# Patient Record
Sex: Female | Born: 1980 | Race: White | Hispanic: No | Marital: Married | Smoking: Never smoker
Health system: Southern US, Community
[De-identification: ages and names within clinical notes are randomized; demographics above are authoritative.]

## PROBLEM LIST (undated history)

## (undated) DIAGNOSIS — F32A Depression, unspecified: Secondary | ICD-10-CM

## (undated) DIAGNOSIS — F329 Major depressive disorder, single episode, unspecified: Secondary | ICD-10-CM

## (undated) DIAGNOSIS — T4145XA Adverse effect of unspecified anesthetic, initial encounter: Secondary | ICD-10-CM

## (undated) DIAGNOSIS — L732 Hidradenitis suppurativa: Secondary | ICD-10-CM

## (undated) DIAGNOSIS — E785 Hyperlipidemia, unspecified: Secondary | ICD-10-CM

## (undated) DIAGNOSIS — O909 Complication of the puerperium, unspecified: Secondary | ICD-10-CM

## (undated) DIAGNOSIS — M419 Scoliosis, unspecified: Secondary | ICD-10-CM

## (undated) DIAGNOSIS — L309 Dermatitis, unspecified: Secondary | ICD-10-CM

## (undated) DIAGNOSIS — Z973 Presence of spectacles and contact lenses: Secondary | ICD-10-CM

## (undated) DIAGNOSIS — K219 Gastro-esophageal reflux disease without esophagitis: Secondary | ICD-10-CM

## (undated) DIAGNOSIS — T8859XA Other complications of anesthesia, initial encounter: Secondary | ICD-10-CM

## (undated) HISTORY — PX: COLONOSCOPY: SHX174

## (undated) HISTORY — PX: CHOLECYSTECTOMY: SHX55

## (undated) HISTORY — PX: ABDOMINAL HYSTERECTOMY: SHX81

## (undated) HISTORY — PX: UPPER GI ENDOSCOPY: SHX6162

---

## 2015-10-24 DIAGNOSIS — E785 Hyperlipidemia, unspecified: Secondary | ICD-10-CM | POA: Insufficient documentation

## 2016-02-27 ENCOUNTER — Ambulatory Visit: Payer: Self-pay | Admitting: Internal Medicine

## 2016-06-04 DIAGNOSIS — E782 Mixed hyperlipidemia: Secondary | ICD-10-CM | POA: Insufficient documentation

## 2017-02-16 ENCOUNTER — Inpatient Hospital Stay
Admission: EM | Admit: 2017-02-16 | Discharge: 2017-02-17 | DRG: 871 | Disposition: A | Discharge status: Home / Self Care | Payer: 59 | Attending: Internal Medicine | Admitting: Internal Medicine

## 2017-02-16 ENCOUNTER — Emergency Department: Payer: 59

## 2017-02-16 ENCOUNTER — Encounter: Payer: Self-pay | Admitting: Emergency Medicine

## 2017-02-16 ENCOUNTER — Other Ambulatory Visit: Payer: Self-pay

## 2017-02-16 DIAGNOSIS — J9601 Acute respiratory failure with hypoxia: Secondary | ICD-10-CM | POA: Diagnosis present

## 2017-02-16 DIAGNOSIS — K2960 Other gastritis without bleeding: Secondary | ICD-10-CM | POA: Diagnosis present

## 2017-02-16 DIAGNOSIS — Z9049 Acquired absence of other specified parts of digestive tract: Secondary | ICD-10-CM | POA: Diagnosis not present

## 2017-02-16 DIAGNOSIS — F329 Major depressive disorder, single episode, unspecified: Secondary | ICD-10-CM | POA: Diagnosis present

## 2017-02-16 DIAGNOSIS — G8929 Other chronic pain: Secondary | ICD-10-CM | POA: Diagnosis present

## 2017-02-16 DIAGNOSIS — E785 Hyperlipidemia, unspecified: Secondary | ICD-10-CM | POA: Diagnosis present

## 2017-02-16 DIAGNOSIS — Z90710 Acquired absence of both cervix and uterus: Secondary | ICD-10-CM

## 2017-02-16 DIAGNOSIS — K529 Noninfective gastroenteritis and colitis, unspecified: Secondary | ICD-10-CM | POA: Diagnosis present

## 2017-02-16 DIAGNOSIS — J189 Pneumonia, unspecified organism: Secondary | ICD-10-CM

## 2017-02-16 DIAGNOSIS — R1010 Upper abdominal pain, unspecified: Secondary | ICD-10-CM

## 2017-02-16 DIAGNOSIS — K2210 Ulcer of esophagus without bleeding: Secondary | ICD-10-CM | POA: Diagnosis present

## 2017-02-16 DIAGNOSIS — K449 Diaphragmatic hernia without obstruction or gangrene: Secondary | ICD-10-CM | POA: Diagnosis present

## 2017-02-16 DIAGNOSIS — Z79899 Other long term (current) drug therapy: Secondary | ICD-10-CM | POA: Diagnosis not present

## 2017-02-16 DIAGNOSIS — A419 Sepsis, unspecified organism: Principal | ICD-10-CM | POA: Diagnosis present

## 2017-02-16 DIAGNOSIS — J690 Pneumonitis due to inhalation of food and vomit: Secondary | ICD-10-CM | POA: Diagnosis present

## 2017-02-16 DIAGNOSIS — M4184 Other forms of scoliosis, thoracic region: Secondary | ICD-10-CM | POA: Diagnosis present

## 2017-02-16 HISTORY — DX: Complication of the puerperium, unspecified: O90.9

## 2017-02-16 HISTORY — DX: Depression, unspecified: F32.A

## 2017-02-16 HISTORY — DX: Hyperlipidemia, unspecified: E78.5

## 2017-02-16 HISTORY — DX: Major depressive disorder, single episode, unspecified: F32.9

## 2017-02-16 LAB — COMPREHENSIVE METABOLIC PANEL
ALT: 26 U/L (ref 14–54)
AST: 34 U/L (ref 15–41)
Albumin: 4.3 g/dL (ref 3.5–5.0)
Alkaline Phosphatase: 86 U/L (ref 38–126)
Anion gap: 6 (ref 5–15)
BILIRUBIN TOTAL: 0.7 mg/dL (ref 0.3–1.2)
BUN: 11 mg/dL (ref 6–20)
CALCIUM: 9.3 mg/dL (ref 8.9–10.3)
CO2: 24 mmol/L (ref 22–32)
CREATININE: 0.91 mg/dL (ref 0.44–1.00)
Chloride: 107 mmol/L (ref 101–111)
Glucose, Bld: 130 mg/dL — ABNORMAL HIGH (ref 65–99)
Potassium: 3.9 mmol/L (ref 3.5–5.1)
Sodium: 137 mmol/L (ref 135–145)
TOTAL PROTEIN: 8 g/dL (ref 6.5–8.1)

## 2017-02-16 LAB — URINALYSIS, COMPLETE (UACMP) WITH MICROSCOPIC
Bilirubin Urine: NEGATIVE
GLUCOSE, UA: NEGATIVE mg/dL
KETONES UR: NEGATIVE mg/dL
LEUKOCYTES UA: NEGATIVE
NITRITE: NEGATIVE
PH: 7 (ref 5.0–8.0)
Protein, ur: NEGATIVE mg/dL
Specific Gravity, Urine: 1.009 (ref 1.005–1.030)

## 2017-02-16 LAB — INFLUENZA PANEL BY PCR (TYPE A & B)
Influenza A By PCR: NEGATIVE
Influenza B By PCR: NEGATIVE

## 2017-02-16 LAB — LIPASE, BLOOD: Lipase: 27 U/L (ref 11–51)

## 2017-02-16 LAB — CBC
HCT: 42.9 % (ref 35.0–47.0)
Hemoglobin: 14.2 g/dL (ref 12.0–16.0)
MCH: 27.3 pg (ref 26.0–34.0)
MCHC: 33 g/dL (ref 32.0–36.0)
MCV: 82.7 fL (ref 80.0–100.0)
PLATELETS: 267 10*3/uL (ref 150–440)
RBC: 5.19 MIL/uL (ref 3.80–5.20)
RDW: 14.1 % (ref 11.5–14.5)
WBC: 18.4 10*3/uL — ABNORMAL HIGH (ref 3.6–11.0)

## 2017-02-16 LAB — LACTIC ACID, PLASMA
Lactic Acid, Venous: 1.5 mmol/L (ref 0.5–1.9)
Lactic Acid, Venous: 2 mmol/L (ref 0.5–1.9)

## 2017-02-16 LAB — TROPONIN I

## 2017-02-16 LAB — PREGNANCY, URINE: Preg Test, Ur: NEGATIVE

## 2017-02-16 MED ORDER — ONDANSETRON HCL 4 MG/2ML IJ SOLN: 4.0000 mg | Freq: Four times a day (QID) | INTRAMUSCULAR | Status: DC | PRN

## 2017-02-16 MED ORDER — POLYETHYLENE GLYCOL 3350 17 G PO PACK: 17.0000 g | PACK | Freq: Every day | ORAL | Status: DC | PRN

## 2017-02-16 MED ORDER — IBUPROFEN 400 MG PO TABS: 400.0000 mg | ORAL_TABLET | Freq: Four times a day (QID) | ORAL | Status: DC | PRN

## 2017-02-16 MED ORDER — ENOXAPARIN SODIUM 40 MG/0.4ML ~~LOC~~ SOLN
40.0000 mg | SUBCUTANEOUS | Status: DC
  Administered 2017-02-16: 40 mg via SUBCUTANEOUS
  Filled 2017-02-16: qty 0.4

## 2017-02-16 MED ORDER — METHYLPREDNISOLONE SODIUM SUCC 125 MG IJ SOLR
60.0000 mg | Freq: Once | INTRAMUSCULAR | Status: AC
  Administered 2017-02-16: 60 mg via INTRAVENOUS
  Filled 2017-02-16: qty 2

## 2017-02-16 MED ORDER — ONDANSETRON HCL 4 MG PO TABS: 4.0000 mg | ORAL_TABLET | Freq: Four times a day (QID) | ORAL | Status: DC | PRN

## 2017-02-16 MED ORDER — SODIUM CHLORIDE 0.9 % IV BOLUS (SEPSIS)
1000.0000 mL | Freq: Once | INTRAVENOUS | Status: AC
  Administered 2017-02-16: 1000 mL via INTRAVENOUS

## 2017-02-16 MED ORDER — MORPHINE SULFATE (PF) 2 MG/ML IV SOLN: 2.0000 mg | INTRAVENOUS | Status: DC | PRN

## 2017-02-16 MED ORDER — ACETAMINOPHEN 325 MG PO TABS: 650.0000 mg | ORAL_TABLET | Freq: Four times a day (QID) | ORAL | Status: DC | PRN

## 2017-02-16 MED ORDER — SODIUM CHLORIDE 0.9 % IV SOLN
2.0000 g | Freq: Once | INTRAVENOUS | Status: AC
  Administered 2017-02-16: 2 g via INTRAVENOUS
  Filled 2017-02-16: qty 2

## 2017-02-16 MED ORDER — HYDROMORPHONE HCL 1 MG/ML IJ SOLN
1.0000 mg | Freq: Once | INTRAMUSCULAR | Status: AC
  Administered 2017-02-16: 1 mg via INTRAVENOUS

## 2017-02-16 MED ORDER — SERTRALINE HCL 50 MG PO TABS: 100.0000 mg | ORAL_TABLET | Freq: Every day | ORAL | Status: DC

## 2017-02-16 MED ORDER — ONDANSETRON HCL 4 MG/2ML IJ SOLN
4.0000 mg | Freq: Once | INTRAMUSCULAR | Status: AC
  Administered 2017-02-16: 4 mg via INTRAVENOUS
  Filled 2017-02-16: qty 2

## 2017-02-16 MED ORDER — HYDROCODONE-ACETAMINOPHEN 5-325 MG PO TABS
1.0000 | ORAL_TABLET | ORAL | Status: DC | PRN
  Administered 2017-02-17: 02:00:00 1 via ORAL
  Filled 2017-02-16: qty 1

## 2017-02-16 MED ORDER — PRAVASTATIN SODIUM 20 MG PO TABS: 10.0000 mg | ORAL_TABLET | Freq: Every day | ORAL | Status: DC

## 2017-02-16 MED ORDER — DIPHENHYDRAMINE HCL 25 MG PO CAPS
50.0000 mg | ORAL_CAPSULE | Freq: Four times a day (QID) | ORAL | Status: DC | PRN
  Administered 2017-02-16: 23:00:00 50 mg via ORAL
  Filled 2017-02-16: qty 2

## 2017-02-16 MED ORDER — BISACODYL 5 MG PO TBEC: 5.0000 mg | DELAYED_RELEASE_TABLET | Freq: Every day | ORAL | Status: DC | PRN

## 2017-02-16 MED ORDER — LEVOFLOXACIN IN D5W 750 MG/150ML IV SOLN
750.0000 mg | INTRAVENOUS | Status: DC
  Administered 2017-02-17: 750 mg via INTRAVENOUS
  Filled 2017-02-16 (×2): qty 150

## 2017-02-16 MED ORDER — MORPHINE SULFATE (PF) 4 MG/ML IV SOLN
4.0000 mg | Freq: Once | INTRAVENOUS | Status: AC
  Administered 2017-02-16: 4 mg via INTRAVENOUS

## 2017-02-16 MED ORDER — ACETAMINOPHEN 650 MG RE SUPP: 650.0000 mg | Freq: Four times a day (QID) | RECTAL | Status: DC | PRN

## 2017-02-16 MED ORDER — MORPHINE SULFATE (PF) 4 MG/ML IV SOLN
4.0000 mg | Freq: Once | INTRAVENOUS | Status: AC
  Administered 2017-02-16: 4 mg via INTRAVENOUS
  Filled 2017-02-16: qty 1

## 2017-02-16 MED ORDER — HYDROCORTISONE 1 % EX CREA
1.0000 "application " | TOPICAL_CREAM | Freq: Three times a day (TID) | CUTANEOUS | Status: DC | PRN
  Administered 2017-02-16: 1 via TOPICAL
  Filled 2017-02-16: qty 28

## 2017-02-16 MED ORDER — IOPAMIDOL (ISOVUE-300) INJECTION 61%
30.0000 mL | Freq: Once | INTRAVENOUS | Status: AC | PRN
  Administered 2017-02-16: 30 mL via ORAL

## 2017-02-16 MED ORDER — HYDROMORPHONE HCL 1 MG/ML IJ SOLN
INTRAMUSCULAR | Status: AC
  Filled 2017-02-16: qty 1

## 2017-02-16 MED ORDER — VANCOMYCIN HCL IN DEXTROSE 1-5 GM/200ML-% IV SOLN
1000.0000 mg | Freq: Once | INTRAVENOUS | Status: AC
  Administered 2017-02-16: 1000 mg via INTRAVENOUS
  Filled 2017-02-16: qty 200

## 2017-02-16 MED ORDER — IOPAMIDOL (ISOVUE-370) INJECTION 76%
125.0000 mL | Freq: Once | INTRAVENOUS | Status: AC | PRN
  Administered 2017-02-16: 125 mL via INTRAVENOUS

## 2017-02-16 MED ORDER — MORPHINE SULFATE (PF) 4 MG/ML IV SOLN
INTRAVENOUS | Status: AC
  Administered 2017-02-16: 4 mg via INTRAVENOUS
  Filled 2017-02-16: qty 1

## 2017-02-16 NOTE — Progress Notes (Signed)
Pharmacy Antibiotic Note  Jennifer Mcintyre is a 37 y.o. female admitted on 02/16/2017 with pneumonia.  Pharmacy has been consulted for levofloxacin dosing.  Plan: Levofloxacin 750 mg IV q24h  Height: 6\' 1"  (185.4 cm) Weight: 230 lb (104.3 kg) IBW/kg (Calculated) : 75.4  Temp (24hrs), Avg:99.8 F (37.7 C), Min:99.8 F (37.7 C), Max:99.8 F (37.7 C)  Recent Labs  Lab 02/16/17 1544 02/16/17 1559  WBC 18.4*  --   CREATININE 0.91  --   LATICACIDVEN  --  1.5    Estimated Creatinine Clearance: 117.4 mL/min (by C-G formula based on SCr of 0.91 mg/dL).    No Known Allergies  Antimicrobials this admission: levofloxacin 2/13 >>  Vancomycin + cefepime in ED 2/13  Dose adjustments this admission:  Microbiology results: 2/13 BCx: Sent  Thank you for allowing pharmacy to be a part of this patient's care.  Cindi CarbonMary M Sophea Rackham, PharmD, BCPS Clinical Pharmacist 02/16/2017 7:37 PM

## 2017-02-16 NOTE — Progress Notes (Signed)
CODE SEPSIS - PHARMACY COMMUNICATION  **Broad Spectrum Antibiotics should be administered within 1 hour of Sepsis diagnosis**  Time Code Sepsis Called/Page Received: 1845  Antibiotics Ordered: cefepime, vancomycin  Time of 1st antibiotic administration: 1931  Additional action taken by pharmacy: n/a  If necessary, Name of Provider/Nurse Contacted: n/a  Cindi CarbonMary M Amberlyn Martinezgarcia ,PharmD Clinical Pharmacist  02/16/2017  7:33 PM

## 2017-02-16 NOTE — ED Notes (Signed)
Pt states she has had body aches and fever since 1pm today.

## 2017-02-16 NOTE — ED Triage Notes (Signed)
Pt arrived from [facility]. Pt reports a drop in oxygen saturation during procedure and procedure was stopped. PT was then given albuterol treatment. PT went home and started to have "fever and chill." PT then went back to clinic who noticed a high pulse reading and sent patient into ED to be evaluated.

## 2017-02-16 NOTE — Progress Notes (Signed)
CRITICAL VALUE ALERT  Critical Value:  Lactic Acid of 2.0  Date & Time Notied:  02/16/2017 2215  Provider Notified: Cammy CopaAngela Maier, MD  Orders Received/Actions taken: Will continue with present treatment plan per MD

## 2017-02-16 NOTE — ED Provider Notes (Signed)
[facility] Emergency Department Provider Note  ____________________________________________   First MD Initiated Contact with Patient 02/16/17 1556     (approximate)  I have reviewed the triage vital signs and the nursing notes.   HISTORY  Chief Complaint Abdominal Pain   HPI Jennifer Mcintyre is a 37 y.o. female with a history of depression, as well as chronic diarrhea and abdominal pain status post EGD this morning who is presenting with fever and back pain.  Patient says that her pain is a 7 out of 10 and sharp.  She says the pain is to her left lower thoracic and left upper lumbar region.  It is worse with inspiration.  Patient's EGD had to be stopped this morning after she desaturated to the 60s in the 70s.  I discussed the case with Dr. Marva PandaSkulskie who was performing the EGD.  He says that the patient was sedated with propofol by anesthesia.  He says that she desatted and started to cough.  He said that while this was happening he was doing biopsies in the duodenum.  He says that the hypoxia resolved as the patient's sedation wore off.  However, when the patient came back to the office later in the day she had a fever as well as tachycardia.  Patient denies any cough at this time.  Denies any shortness of breath.  Denies any known sick contacts.  Says that she has been having body aches.  Says that she took Tylenol at 1 PM in response to saying that her temperature was 101 at home.  Dr. Marva PandaSkulskie sent her to the emergency department for further workup and with specific concern for "microperforations."   Past Medical History:  Diagnosis Date  . Cesarean section wound complication   . Depression   . Hyperlipemia     Patient Active Problem List   Diagnosis Date Noted  . Sepsis (HCC) 02/16/2017    Past Surgical History:  Procedure Laterality Date  . CHOLECYSTECTOMY    . COLONOSCOPY      Prior to Admission medications   Medication Sig Start Date End Date  Taking? Authorizing Provider  buPROPion (WELLBUTRIN) 100 MG tablet Take 300 mg by mouth 2 (two) times daily.   Yes [provider]  pravastatin (PRAVACHOL) 10 MG tablet Take 10 mg by mouth daily.   Yes [provider]  sertraline (ZOLOFT) 100 MG tablet Take 100 mg by mouth daily.   Yes [provider]    Allergies Patient has no known allergies.  No family history on file.  Social History Social History   Tobacco Use  . Smoking status: Never Smoker  . Smokeless tobacco: Never Used  Substance Use Topics  . Alcohol use: Yes  . Drug use: No    Review of Systems  Constitutional: No fever/chills Eyes: No visual changes. ENT: No sore throat. Cardiovascular: Denies chest pain. Respiratory: Denies shortness of breath. Gastrointestinal:   No nausea, no vomiting.  No diarrhea.   Genitourinary: Negative for dysuria. Musculoskeletal: As above Skin: Negative for rash. Neurological: Negative for headaches, focal weakness or numbness.   ____________________________________________   PHYSICAL EXAM:  VITAL SIGNS: ED Triage Vitals  Enc Vitals Group     BP 02/16/17 1543 129/78     Pulse Rate 02/16/17 1543 (!) 131     Resp 02/16/17 1543 (!) 22     Temp 02/16/17 1543 99.8 F (37.7 C)     Temp Source 02/16/17 1543 Oral  SpO2 02/16/17 1543 96 %     Weight 02/16/17 1543 230 lb (104.3 kg)     Height 02/16/17 1543 6\' 1"  (1.854 m)     Head Circumference --      Peak Flow --      Pain Score 02/16/17 1542 7     Pain Loc --      Pain Edu? --      Excl. in GC? --     Constitutional: Alert and oriented. Well appearing and in no acute distress. Eyes: Conjunctivae are normal.  Head: Atraumatic. Nose: No congestion/rhinnorhea. Mouth/Throat: Mucous membranes are moist.  Neck: No stridor.   Cardiovascular: Tachycardic, regular rhythm. Grossly normal heart sounds.  Good peripheral circulation. Respiratory: Normal respiratory effort.  No retractions. Lungs  CTAB. Gastrointestinal: Soft with moderate left upper quadrant tenderness without rebound or guarding. No distention. No CVA tenderness. Musculoskeletal: No lower extremity tenderness nor edema.  No joint effusions. Neurologic:  Normal speech and language. No gross focal neurologic deficits are appreciated. Skin:  Skin is warm, dry and intact. No rash noted. Psychiatric: Mood and affect are normal. Speech and behavior are normal.  ____________________________________________   LABS (all labs ordered are listed, but only abnormal results are displayed)  Labs Reviewed  COMPREHENSIVE METABOLIC PANEL - Abnormal; Notable for the following components:      Result Value   Glucose, Bld 130 (*)    All other components within normal limits  CBC - Abnormal; Notable for the following components:   WBC 18.4 (*)    All other components within normal limits  URINALYSIS, COMPLETE (UACMP) WITH MICROSCOPIC - Abnormal; Notable for the following components:   Color, Urine YELLOW (*)    APPearance HAZY (*)    Hgb urine dipstick SMALL (*)    Bacteria, UA RARE (*)    Squamous Epithelial / LPF 0-5 (*)    All other components within normal limits  CULTURE, BLOOD (ROUTINE X 2)  CULTURE, BLOOD (ROUTINE X 2)  LIPASE, BLOOD  LACTIC ACID, PLASMA  PREGNANCY, URINE  INFLUENZA PANEL BY PCR (TYPE A & B)  TROPONIN I  LACTIC ACID, PLASMA  INFLUENZA PANEL BY PCR (TYPE A & B)   ____________________________________________  EKG  ED ECG REPORT I, Arelia Longest, the attending physician, personally viewed and interpreted this ECG.   Date: 02/16/2017  EKG Time: 1750  Rate: 100  Rhythm: sinus tachycardia  Axis: Normal  Intervals:none  ST&T Change: No ST segment elevation or depression.  T wave inversions in 3 as well as aVF.  No previous for comparison.  ____________________________________________  RADIOLOGY  Left lower lobe scar versus atelectasis.  CT chest with bilateral lower lobe pneumonias  as well as lingular pneumonia on the left.  Abdomen without evidence of perforation or free air. ____________________________________________   PROCEDURES  Procedure(s) performed:   Procedures  Critical Care performed:   ____________________________________________   INITIAL IMPRESSION / ASSESSMENT AND PLAN / ED COURSE  Pertinent labs & imaging results that were available during my care of the patient were reviewed by me and considered in my medical decision making (see chart for details).  Differential diagnosis includes, but is not limited to, biliary disease (biliary colic, acute cholecystitis, cholangitis, choledocholithiasis, etc), intrathoracic causes for epigastric abdominal pain including ACS, gastritis, duodenitis, pancreatitis, small bowel or large bowel obstruction, abdominal aortic aneurysm, hernia, and gastritis. Differential includes, but is not limited to, viral syndrome, bronchitis including COPD exacerbation, pneumonia, reactive airway disease including asthma, CHF including  exacerbation with or without pulmonary/interstitial edema, pneumothorax, ACS, thoracic trauma, and pulmonary embolism. As part of my medical decision making, I reviewed the following data within the electronic MEDICAL RECORD NUMBER Old chart reviewed and Discussed the case with Dr. Marva Panda.    ----------------------------------------- 6:52 PM on 02/16/2017 -----------------------------------------  Patient requiring multiple doses of opiates.  She has received 8 mg of morphine as well as a milligram of Dilaudid.  She is still having epigastric as well as left upper quadrant pain at this time.  I discussed the case with Dr. Earlene Plater of surgery who also reviewed the scan who did not see any evidence of perforation.  I also discussed case with Dr. Marva Panda who says that he will see the patient in consultation in the morning.  Patient will be admitted for pneumonia.  Because the pneumonia could be from  aspiration and could have resulted during this hospital based procedure of she will be given broad-spectrum antibiotics.  Patient as well as husband are understanding of the diagnosis as well as treatment plan and willing to comply.  Signed out to Dr. Tildon Husky of the hospitalist service.  Sepsis alert called.      ____________________________________________   FINAL CLINICAL IMPRESSION(S) / ED DIAGNOSES  HCAP.  Upper abdominal pain. Sepsis    NEW MEDICATIONS STARTED DURING THIS VISIT:  New Prescriptions   No medications on file     Note:  This document was prepared using Dragon voice recognition software and may include unintentional dictation errors.     Myrna Blazer, MD 02/16/17 [phone]

## 2017-02-16 NOTE — H&P (Signed)
[facility] [place] [facility]   PATIENT NAME: Jennifer Mcintyre    MR#:  [id number]  DATE OF BIRTH:  July 14, 1980  DATE OF ADMISSION:  02/16/2017  PRIMARY CARE PHYSICIAN: Dan Humphreys, [facility]   REQUESTING/REFERRING PHYSICIAN: dr Langston Masker CHIEF COMPLAINT:   Abdominal pain and fevers HISTORY OF PRESENT ILLNESS:  Jennifer Mcintyre  is a 37 y.o. female with a known history of depression who presents today to the emergency room with a chief complaint of midepigastric abdominal pain and fevers. Patient had an EGD today that was aborted due to the patient having low oxygen saturations and started to cough. The hypoxia resolved as the patient's sedation wore off. Patient then went home and at home she developed fever and chills. She went back to the office and she was found to have tachycardia as well as fever. Patient reports prior to this procedure she did not have a fever. There was a concern of microperforations post-perforation given the patient's abdominal pain. CT scan of the abdomen and chest does not show evidence of perforation. ED physician also asked the surgery service to look at the CT scan and the surgeon apparently confirms that there is no evidence of perforation. Lipase level was normal. EGD and colonoscopy were planned today due to patient having chronic diarrhea    PAST MEDICAL HISTORY:   Past Medical History:  Diagnosis Date  . Cesarean section wound complication   . Depression   . Hyperlipemia     PAST SURGICAL HISTORY:   Past Surgical History:  Procedure Laterality Date  . CHOLECYSTECTOMY    . COLONOSCOPY      SOCIAL HISTORY:   Social History   Tobacco Use  . Smoking status: Never Smoker  . Smokeless tobacco: Never Used  Substance Use Topics  . Alcohol use: Yes    FAMILY HISTORY:  No family history on file.  DRUG ALLERGIES:  No Known Allergies  REVIEW OF SYSTEMS:   Review of Systems  Constitutional: Positive for chills and  fever. Negative for malaise/fatigue.  HENT: Negative.  Negative for ear discharge, ear pain, hearing loss, nosebleeds and sore throat.   Eyes: Negative.  Negative for blurred vision and pain.  Respiratory: Negative.  Negative for cough, hemoptysis, shortness of breath and wheezing.   Cardiovascular: Negative.  Negative for chest pain, palpitations and leg swelling.  Gastrointestinal: Positive for abdominal pain and diarrhea (Chronic). Negative for blood in stool, nausea and vomiting.  Genitourinary: Negative.  Negative for dysuria.  Musculoskeletal: Negative.  Negative for back pain.  Skin: Negative.   Neurological: Negative for dizziness, tremors, speech change, focal weakness, seizures and headaches.  Endo/Heme/Allergies: Negative.  Does not bruise/bleed easily.  Psychiatric/Behavioral: Negative.  Negative for depression, hallucinations and suicidal ideas.    MEDICATIONS AT HOME:   Prior to Admission medications   Medication Sig Start Date End Date Taking? Authorizing Provider  buPROPion (WELLBUTRIN) 100 MG tablet Take 300 mg by mouth 2 (two) times daily.   Yes [provider]  pravastatin (PRAVACHOL) 10 MG tablet Take 10 mg by mouth daily.   Yes [provider]  sertraline (ZOLOFT) 100 MG tablet Take 100 mg by mouth daily.   Yes [provider]      VITAL SIGNS:  Blood pressure 123/80, pulse (!) 131, temperature 99.8 F (37.7 C), temperature source Oral, resp. rate 18, height 6\' 1"  (1.854 m), weight 104.3 kg (230 lb), SpO2 96 %.  PHYSICAL EXAMINATION:   Physical Exam  Constitutional:  She is oriented to person, place, and time and well-developed, well-nourished, and in no distress. No distress.  HENT:  Head: Normocephalic.  Eyes: No scleral icterus.  Neck: Normal range of motion. Neck supple. No JVD present. No tracheal deviation present.  Cardiovascular: Normal rate, regular rhythm and normal heart sounds. Exam reveals no gallop and no friction rub.   No murmur heard. Pulmonary/Chest: Effort normal. No respiratory distress. She has no wheezes. She has no rales. She exhibits no tenderness.  Crackles heard at the left base  Abdominal: Soft. Bowel sounds are normal. She exhibits no distension and no mass. There is tenderness. There is no rebound and no guarding.  Musculoskeletal: Normal range of motion. She exhibits no edema.  Neurological: She is alert and oriented to person, place, and time.  Skin: Skin is warm. No rash noted. No erythema.  Psychiatric: Affect and judgment normal.      LABORATORY PANEL:   CBC Recent Labs  Lab 02/16/17 1544  WBC 18.4*  HGB 14.2  HCT 42.9  PLT 267   ------------------------------------------------------------------------------------------------------------------  Chemistries  Recent Labs  Lab 02/16/17 1544  NA 137  K 3.9  CL 107  CO2 24  GLUCOSE 130*  BUN 11  CREATININE 0.91  CALCIUM 9.3  AST 34  ALT 26  ALKPHOS 86  BILITOT 0.7   ------------------------------------------------------------------------------------------------------------------  Cardiac Enzymes Recent Labs  Lab 02/16/17 1640  TROPONINI <0.03   ------------------------------------------------------------------------------------------------------------------  RADIOLOGY:  Dg Chest 2 View  Result Date: 02/16/2017 CLINICAL DATA:  Hypoxia. EXAM: CHEST  2 VIEW COMPARISON:  none FINDINGS: Normal heart size. There is no pleural effusion or edema. Lungs are hyperinflated and there are coarsened interstitial markings noted bilaterally. Scar versus platelike atelectasis is identified within the left lower lobe. IMPRESSION: 1. Left lower lobe scar versus platelike atelectasis. 2. Hyperinflation. Electronically Signed   By: Signa Kellaylor  Stroud M.D.   On: 02/16/2017 16:40   Ct Angio Chest Pe W And/or Wo Contrast  Result Date: 02/16/2017 CLINICAL DATA:  37 year old female with hypoxia during EGD. Patient had fever and chills  after returning home. Dyspnea. EXAM: CT ANGIOGRAPHY CHEST CT ABDOMEN AND PELVIS WITH CONTRAST TECHNIQUE: Multidetector CT imaging of the chest was performed using the standard protocol during bolus administration of intravenous contrast. Multiplanar CT image reconstructions and MIPs were obtained to evaluate the vascular anatomy. Multidetector CT imaging of the abdomen and pelvis was performed using the standard protocol during bolus administration of intravenous contrast. CONTRAST:  125mL ISOVUE-370 IOPAMIDOL (ISOVUE-370) INJECTION 76% COMPARISON:  None. FINDINGS: CTA CHEST FINDINGS Cardiovascular: Heart size is normal. No pericardial effusion is identified. No large central pulmonary embolus is noted. Aorta is not aneurysmal. No dissection is visualized. Cardiac motion artifacts limit assessment along the ascending portion. Mediastinum/Nodes: No evidence of pneumomediastinum. Patent midline trachea and mainstem bronchi. Unremarkable CT appearance of the esophagus. There is no lymphadenopathy. Lungs/Pleura: Patchy airspace opacities are noted at each lung base left greater than right. Small lingular subpleural acinar opacities are also noted. Findings may reflect evolving pneumonia and atelectasis. No effusion or pneumothorax. Musculoskeletal: No chest wall abnormality. No acute or significant osseous findings. Review of the MIP images confirms the above findings. CT ABDOMEN and PELVIS FINDINGS Hepatobiliary: No focal liver abnormality is seen. Status post cholecystectomy. No biliary dilatation. Pancreas: Unremarkable. No pancreatic ductal dilatation or surrounding inflammatory changes. Spleen: Normal in size without focal abnormality. Adrenals/Urinary Tract: Adrenal glands are unremarkable. Kidneys are normal, without renal calculi, focal lesion, or hydronephrosis. 6 mm calcification is  seen along the left dependent wall of the bladder consistent with a bladder calculus. Given lack of hydroureteronephrosis, a  calculus in the distal most aspect of the left ureter is believed less likely. Stomach/Bowel: The stomach is physiologically distended with food. Normal small bowel rotation is noted without bowel obstruction or inflammation. The colon is unremarkable. The distal and terminal ileum are normal. Appendix is normal. Vascular/Lymphatic: No significant vascular findings are present. No enlarged abdominal or pelvic lymph nodes. Reproductive: Status post hysterectomy.  No adnexal mass. Other: Small periumbilical fat containing hernia. No abdominopelvic ascites. Musculoskeletal: No acute or significant osseous findings. Review of the MIP images confirms the above findings. IMPRESSION: 1. Patchy alveolar airspace opacities in the lingula and both lower lobes left greater than right compatible with pneumonia. 2. No acute solid nor hollow visceral organ abnormality within the abdomen and pelvis. 3. 6 mm left bladder calculus is noted.  No hydroureteronephrosis. Electronically Signed   By: Tollie Eth M.D.   On: 02/16/2017 18:23   Ct Abdomen Pelvis W Contrast  Result Date: 02/16/2017 CLINICAL DATA:  36 year old female with hypoxia during EGD. Patient had fever and chills after returning home. Dyspnea. EXAM: CT ANGIOGRAPHY CHEST CT ABDOMEN AND PELVIS WITH CONTRAST TECHNIQUE: Multidetector CT imaging of the chest was performed using the standard protocol during bolus administration of intravenous contrast. Multiplanar CT image reconstructions and MIPs were obtained to evaluate the vascular anatomy. Multidetector CT imaging of the abdomen and pelvis was performed using the standard protocol during bolus administration of intravenous contrast. CONTRAST:  ISOVUE-370 IOPAMIDOL (ISOVUE-370) INJECTION 76% COMPARISON:  None. FINDINGS: CTA CHEST FINDINGS Cardiovascular: Heart size is normal. No pericardial effusion is identified. No large central pulmonary embolus is noted. Aorta is not aneurysmal. No dissection is visualized.  Cardiac motion artifacts limit assessment along the ascending portion. Mediastinum/Nodes: No evidence of pneumomediastinum. Patent midline trachea and mainstem bronchi. Unremarkable CT appearance of the esophagus. There is no lymphadenopathy. Lungs/Pleura: Patchy airspace opacities are noted at each lung base left greater than right. Small lingular subpleural acinar opacities are also noted. Findings may reflect evolving pneumonia and atelectasis. No effusion or pneumothorax. Musculoskeletal: No chest wall abnormality. No acute or significant osseous findings. Review of the MIP images confirms the above findings. CT ABDOMEN and PELVIS FINDINGS Hepatobiliary: No focal liver abnormality is seen. Status post cholecystectomy. No biliary dilatation. Pancreas: Unremarkable. No pancreatic ductal dilatation or surrounding inflammatory changes. Spleen: Normal in size without focal abnormality. Adrenals/Urinary Tract: Adrenal glands are unremarkable. Kidneys are normal, without renal calculi, focal lesion, or hydronephrosis. 6 mm calcification is seen along the left dependent wall of the bladder consistent with a bladder calculus. Given lack of hydroureteronephrosis, a calculus in the distal most aspect of the left ureter is believed less likely. Stomach/Bowel: The stomach is physiologically distended with food. Normal small bowel rotation is noted without bowel obstruction or inflammation. The colon is unremarkable. The distal and terminal ileum are normal. Appendix is normal. Vascular/Lymphatic: No significant vascular findings are present. No enlarged abdominal or pelvic lymph nodes. Reproductive: Status post hysterectomy.  No adnexal mass. Other: Small periumbilical fat containing hernia. No abdominopelvic ascites. Musculoskeletal: No acute or significant osseous findings. Review of the MIP images confirms the above findings. IMPRESSION: 1. Patchy alveolar airspace opacities in the lingula and both lower lobes left  greater than right compatible with pneumonia. 2. No acute solid nor hollow visceral organ abnormality within the abdomen and pelvis. 3. 6 mm left bladder calculus is noted.  No hydroureteronephrosis. Electronically Signed   By: Tollie Eth M.D.   On: 02/16/2017 18:23    EKG:   Sinus tachycardia no ST elevation or depression  IMPRESSION AND PLAN:   37 year old female with history of depression who underwent EGD today for chronic diarrhea and subsequently this procedure was aborted due to hypoxia then presented back to the GI clinic due to fevers within that sent to the ER for further evaluation.  1. Sepsis: Patient presents with tachycardia, tachypnea and leukocytosis Sepsis is due to community-acquired pneumonia  2. Community acquired pneumonia: Start Levaquin and follow up on blood cultures  3. Abdominal pain: I am suspecting this is due to the procedure and or related to her pneumonia CT scan shows no evidence of perforation or acute etiology Continue to monitor  4. Depression: Continue outpatient medications  5. Chronic diarrhea: Patient will have outpatient follow-up with GI     All the records are reviewed and case discussed with ED provider. Management plans discussed with the patient and she is in agreement  CODE STATUS: FULL  TOTAL TIME TAKING CARE OF THIS PATIENT: 40 minutes.    Collyn Ribas M.D on 02/16/2017 at 7:10 PM  Between 7am to 6pm - Pager - [phone]  After 6pm go to [email or web address] - Social research officer, government  [facility]  Office  [phone]  CC: Primary care physician; [facility]

## 2017-02-16 NOTE — ED Notes (Signed)
Pt states after she started drinking contrast she began having extremely sharp pain under her diaphragm. Pt tearful and moving around in bed. MD notified and instructed pt to stop drinking contrast.

## 2017-02-16 NOTE — ED Notes (Signed)
Patient transported to CT 

## 2017-02-17 LAB — BASIC METABOLIC PANEL
Anion gap: 8 (ref 5–15)
BUN: 8 mg/dL (ref 6–20)
CALCIUM: 9.1 mg/dL (ref 8.9–10.3)
CHLORIDE: 108 mmol/L (ref 101–111)
CO2: 22 mmol/L (ref 22–32)
CREATININE: 0.84 mg/dL (ref 0.44–1.00)
GFR calc Af Amer: >60 mL/min (ref >60)
GFR calc non Af Amer: >60 mL/min (ref >60)
Glucose, Bld: 137 mg/dL — ABNORMAL HIGH (ref 65–99)
Potassium: 4.2 mmol/L (ref 3.5–5.1)
SODIUM: 138 mmol/L (ref 135–145)

## 2017-02-17 LAB — CBC
HCT: 39.9 % (ref 35.0–47.0)
Hemoglobin: 13 g/dL (ref 12.0–16.0)
MCH: 27.1 pg (ref 26.0–34.0)
MCHC: 32.6 g/dL (ref 32.0–36.0)
MCV: 83.1 fL (ref 80.0–100.0)
PLATELETS: 254 10*3/uL (ref 150–440)
RBC: 4.81 MIL/uL (ref 3.80–5.20)
RDW: 14 % (ref 11.5–14.5)
WBC: 11.8 10*3/uL — AB (ref 3.6–11.0)

## 2017-02-17 MED ORDER — BUPROPION HCL ER (XL) 150 MG PO TB24
300.0000 mg | ORAL_TABLET | Freq: Every day | ORAL | Status: DC
  Filled 2017-02-17: qty 1

## 2017-02-17 MED ORDER — AMOXICILLIN-POT CLAVULANATE 875-125 MG PO TABS: 1.0000 | ORAL_TABLET | Freq: Two times a day (BID) | ORAL | 0 refills | Status: AC

## 2017-02-17 NOTE — Progress Notes (Signed)
Pt discharged to home. IV's and heart monitor removed. Discharge instructions and education reviewed with the patient and her family, who voiced and demonstrated understanding. Pt escorted to POV by NT via wheelchair.

## 2017-02-17 NOTE — Consult Note (Signed)
GI Inpatient Consult Note Christena Deem MD  Reason for Consult: Sedative aspiration episode of aspiration on endoscopy.   Attending Requesting Consult: Dr. Adrian Saran  Outpatient Primary Physician: Dan Humphreys primary care  History of Present Illness: Jennifer Mcintyre is a 37 y.o. female who was seen yesterday morning at [facility] for a EGD and colonoscopy for problems with abdominal pain and dyspepsia.  Her EGD the scope was introduced without difficulty to the duodenum.  At that point the patient began to desaturate requiring the procedure be terminated measures effective to improve oxygenation.  The scope was not reintroduced.  Biopsies were taken from the small intestine immediately before the procedure was terminated.  There was some mild detail on introduction of the scope including a small hiatal hernia as well as a grade B erosive esophagitis and a diffuse bile reflux gastritis with bile content noted throughout the stomach.  I was unable to take biopsies otherwise due to the need to remove the scope and manage her hypoxia.  This was managed with chin lift and supplemental oxygen as well as position change.  Her oxygenation improved from the 70s slowly to the 90s and eventually 98.  Patient was allowed to awaken and seem to be in no other distress.  Colonoscope was introduced to the rectosigmoid junction however the prep was poor and the procedure was terminated at that point.  Post procedure the patient was noted to have a sore throat in the hypopharyngeal region but denied any abdominal pain or chest pain otherwise.  I did note that when she was being shot suctioned there was some bilious content in her mouth and the possibility of possible aspiration of this content or oral pharyngeal secretions.  I cautioned her that that should she develop a fever or any increased pain or other symptoms she was to get in touch immediately at my office for further evaluation.  She presented to the office with a  complaint of shortness of breath central chest discomfort on coughing or taking a deep breath as well as back pain.  Was also noted to be febrile on her vitals as well as tachycardic.  Blood pressure was good.  Maxim stated that she stopped and did have a meal on the way home from the endoscopy unit.  She denied any dysphagia with her eating or abdominal pain.  She was leaning forward as this seemed to lessen her back pain.  She had no nausea or vomiting.  Of note her procedures were arranged after having been seen on 02/15/2017 left-sided abdominal pain and chronic diarrhea as well as some oropharyngeal discomfort but not dysphagia.  This would be a discomfort when she swallowed.  He does have a history of chronic diarrhea and had a colonoscopy done several years ago that indicated minimal irritation but no reliable etiology.  She had never had an EGD previously.  Omeprazole daily was recommended.  Past Medical History:  Past Medical History:  Diagnosis Date  . Cesarean section wound complication   . Depression   . Hyperlipemia   Depression, unspecified Hyperlipidemia, unspecified History of depression, C-section x3 History of cholecystectomy History of hysterectomy and tubal ligation History of hidradenitis Eczema of the external ear bilaterally Scoliosis of the thoracic spine  Problem List: Patient Active Problem List   Diagnosis Date Noted  . Sepsis (HCC) 02/16/2017    Past Surgical History: Past Surgical History:  Procedure Laterality Date  . CHOLECYSTECTOMY    . COLONOSCOPY  Allergies: No Known Allergies  Home Medications: Medications Prior to Admission  Medication Sig Dispense Refill Last Dose  . buPROPion (WELLBUTRIN XL) 300 MG 24 hr tablet Take 1 tablet by mouth daily.  1 02/16/2017 at Unknown time  . pravastatin (PRAVACHOL) 10 MG tablet Take 10 mg by mouth daily.   02/15/2017 at Unknown time  . sertraline (ZOLOFT) 100 MG tablet Take 100 mg by mouth daily.    02/16/2017 at Unknown time   Home medication reconciliation was completed with the patient.   Scheduled Inpatient Medications:   . buPROPion  300 mg Oral Daily  . enoxaparin (LOVENOX) injection  40 mg Subcutaneous Q24H  . pravastatin  10 mg Oral Daily  . sertraline  100 mg Oral Daily    Continuous Inpatient Infusions:   . levofloxacin (LEVAQUIN) IV Stopped (02/17/17 0145)    PRN Inpatient Medications:  acetaminophen **OR** acetaminophen, bisacodyl, diphenhydrAMINE, HYDROcodone-acetaminophen, hydrocortisone cream, ibuprofen, ondansetron **OR** ondansetron (ZOFRAN) IV, polyethylene glycol  Family History: family history is not on file.   GI Family History: No known GI issues  Social History:   reports that  has never smoked. she has never used smokeless tobacco. She reports that she drinks alcohol. She reports that she does not use drugs. The patient denies ETOH, tobacco, or drug use.   ROS  Review of Systems: Per admission history and physical  Physical Examination: BP 102/62 (BP Location: Left Arm)   Pulse 81   Temp 98.2 F (36.8 C) (Oral)   Resp 16   Ht 6\' 1"  (1.854 m)   Wt 104.3 kg (230 lb)   SpO2 97%   BMI 30.34 kg/m  Gen: Well-appearing 37 year old female no distress HEENT: Normocephalic atraumatic eyes are anicteric Neck: Supple no JVD Chest: Regular rate and rhythm without rub or gallop, lungs are bilaterally clear.  Mild chest discomfort on deep inspiration CV:  Abd: Soft nontender nondistended bowel sounds are positive normoactive Ext: Clubbing cyanosis or edema Skin: Other:  Data: Lab Results  Component Value Date   WBC 11.8 (H) 02/17/2017   HGB 13.0 02/17/2017   HCT 39.9 02/17/2017   MCV 83.1 02/17/2017   PLT 254 02/17/2017   Recent Labs  Lab 02/16/17 1544 02/17/17 0236  HGB 14.2 13.0   Lab Results  Component Value Date   NA 138 02/17/2017   K 4.2 02/17/2017   CL 108 02/17/2017   CO2 22 02/17/2017   BUN 8 02/17/2017   CREATININE 0.84  02/17/2017   Lab Results  Component Value Date   ALT 26 02/16/2017   AST 34 02/16/2017   ALKPHOS 86 02/16/2017   BILITOT 0.7 02/16/2017   No results for input(s): APTT, INR, PTT in the last 168 hours. CBC Latest Ref Rng & Units 02/17/2017 02/16/2017  WBC 3.6 - 11.0 K/uL 11.8(H) 18.4(H)  Hemoglobin 12.0 - 16.0 g/dL 16.1 09.6  Hematocrit 04.5 - 47.0 % 39.9 42.9  Platelets 150 - 440 K/uL 254 267    STUDIES: Dg Chest 2 View  Result Date: 02/16/2017 CLINICAL DATA:  Hypoxia. EXAM: CHEST  2 VIEW COMPARISON:  none FINDINGS: Normal heart size. There is no pleural effusion or edema. Lungs are hyperinflated and there are coarsened interstitial markings noted bilaterally. Scar versus platelike atelectasis is identified within the left lower lobe. IMPRESSION: 1. Left lower lobe scar versus platelike atelectasis. 2. Hyperinflation. Electronically Signed   By: Signa Kell M.D.   On: 02/16/2017 16:40   Ct Angio Chest Pe W And/or Wo Contrast  Result Date: 02/16/2017 CLINICAL DATA:  37 year old female with hypoxia during EGD. Patient had fever and chills after returning home. Dyspnea. EXAM: CT ANGIOGRAPHY CHEST CT ABDOMEN AND PELVIS WITH CONTRAST TECHNIQUE: Multidetector CT imaging of the chest was performed using the standard protocol during bolus administration of intravenous contrast. Multiplanar CT image reconstructions and MIPs were obtained to evaluate the vascular anatomy. Multidetector CT imaging of the abdomen and pelvis was performed using the standard protocol during bolus administration of intravenous contrast. CONTRAST:  ISOVUE-370 IOPAMIDOL (ISOVUE-370) INJECTION 76% COMPARISON:  None. FINDINGS: CTA CHEST FINDINGS Cardiovascular: Heart size is normal. No pericardial effusion is identified. No large central pulmonary embolus is noted. Aorta is not aneurysmal. No dissection is visualized. Cardiac motion artifacts limit assessment along the ascending portion. Mediastinum/Nodes: No evidence of  pneumomediastinum. Patent midline trachea and mainstem bronchi. Unremarkable CT appearance of the esophagus. There is no lymphadenopathy. Lungs/Pleura: Patchy airspace opacities are noted at each lung base left greater than right. Small lingular subpleural acinar opacities are also noted. Findings may reflect evolving pneumonia and atelectasis. No effusion or pneumothorax. Musculoskeletal: No chest wall abnormality. No acute or significant osseous findings. Review of the MIP images confirms the above findings. CT ABDOMEN and PELVIS FINDINGS Hepatobiliary: No focal liver abnormality is seen. Status post cholecystectomy. No biliary dilatation. Pancreas: Unremarkable. No pancreatic ductal dilatation or surrounding inflammatory changes. Spleen: Normal in size without focal abnormality. Adrenals/Urinary Tract: Adrenal glands are unremarkable. Kidneys are normal, without renal calculi, focal lesion, or hydronephrosis. 6 mm calcification is seen along the left dependent wall of the bladder consistent with a bladder calculus. Given lack of hydroureteronephrosis, a calculus in the distal most aspect of the left ureter is believed less likely. Stomach/Bowel: The stomach is physiologically distended with food. Normal small bowel rotation is noted without bowel obstruction or inflammation. The colon is unremarkable. The distal and terminal ileum are normal. Appendix is normal. Vascular/Lymphatic: No significant vascular findings are present. No enlarged abdominal or pelvic lymph nodes. Reproductive: Status post hysterectomy.  No adnexal mass. Other: Small periumbilical fat containing hernia. No abdominopelvic ascites. Musculoskeletal: No acute or significant osseous findings. Review of the MIP images confirms the above findings. IMPRESSION: 1. Patchy alveolar airspace opacities in the lingula and both lower lobes left greater than right compatible with pneumonia. 2. No acute solid nor hollow visceral organ abnormality within  the abdomen and pelvis. 3. 6 mm left bladder calculus is noted.  No hydroureteronephrosis. Electronically Signed   By: Tollie Eth M.D.   On: 02/16/2017 18:23   Ct Abdomen Pelvis W Contrast  Result Date: 02/16/2017 CLINICAL DATA:  37 year old female with hypoxia during EGD. Patient had fever and chills after returning home. Dyspnea. EXAM: CT ANGIOGRAPHY CHEST CT ABDOMEN AND PELVIS WITH CONTRAST TECHNIQUE: Multidetector CT imaging of the chest was performed using the standard protocol during bolus administration of intravenous contrast. Multiplanar CT image reconstructions and MIPs were obtained to evaluate the vascular anatomy. Multidetector CT imaging of the abdomen and pelvis was performed using the standard protocol during bolus administration of intravenous contrast. CONTRAST:  ISOVUE-370 IOPAMIDOL (ISOVUE-370) INJECTION 76% COMPARISON:  None. FINDINGS: CTA CHEST FINDINGS Cardiovascular: Heart size is normal. No pericardial effusion is identified. No large central pulmonary embolus is noted. Aorta is not aneurysmal. No dissection is visualized. Cardiac motion artifacts limit assessment along the ascending portion. Mediastinum/Nodes: No evidence of pneumomediastinum. Patent midline trachea and mainstem bronchi. Unremarkable CT appearance of the esophagus. There is no lymphadenopathy. Lungs/Pleura: Patchy airspace opacities  are noted at each lung base left greater than right. Small lingular subpleural acinar opacities are also noted. Findings may reflect evolving pneumonia and atelectasis. No effusion or pneumothorax. Musculoskeletal: No chest wall abnormality. No acute or significant osseous findings. Review of the MIP images confirms the above findings. CT ABDOMEN and PELVIS FINDINGS Hepatobiliary: No focal liver abnormality is seen. Status post cholecystectomy. No biliary dilatation. Pancreas: Unremarkable. No pancreatic ductal dilatation or surrounding inflammatory changes. Spleen: Normal in size  without focal abnormality. Adrenals/Urinary Tract: Adrenal glands are unremarkable. Kidneys are normal, without renal calculi, focal lesion, or hydronephrosis. 6 mm calcification is seen along the left dependent wall of the bladder consistent with a bladder calculus. Given lack of hydroureteronephrosis, a calculus in the distal most aspect of the left ureter is believed less likely. Stomach/Bowel: The stomach is physiologically distended with food. Normal small bowel rotation is noted without bowel obstruction or inflammation. The colon is unremarkable. The distal and terminal ileum are normal. Appendix is normal. Vascular/Lymphatic: No significant vascular findings are present. No enlarged abdominal or pelvic lymph nodes. Reproductive: Status post hysterectomy.  No adnexal mass. Other: Small periumbilical fat containing hernia. No abdominopelvic ascites. Musculoskeletal: No acute or significant osseous findings. Review of the MIP images confirms the above findings. IMPRESSION: 1. Patchy alveolar airspace opacities in the lingula and both lower lobes left greater than right compatible with pneumonia. 2. No acute solid nor hollow visceral organ abnormality within the abdomen and pelvis. 3. 6 mm left bladder calculus is noted.  No hydroureteronephrosis. Electronically Signed   By: Tollie Ethavid  Kwon M.D.   On: 02/16/2017 18:23   @IMAGES @  Assessment: 1.EGDF with possible aspiration.  Patietn is currently doing better, afebrile,  White count improved.  CT and lab results noted.   Recommendations: 1) agree with current management, will arrange for further outpatient evaluation after dc.  I appreciate IM assistance, thank you. Following with you.  Thank you for the consult. Please call with questions or concerns.  Christena DeemSKULSKIE, MARTIN U, MD  02/17/2017 8:12 AM

## 2017-02-17 NOTE — Consult Note (Signed)
Jennifer Mcintyre is a 37 y.o. female who was seen yesterday morning at [facility] for an EGD and colonoscopy.  She reported being NPO appropriate for the procedure, no history of anesthesia problems, no sleep apnea, no reflux symptoms and no pulmonary problems pre Op.  She was sedated with propofol and midazolam for the procedure.  She tolerated having the endoscopy scope passed into her stomach well.  Her stomach was empty but washings were performed and suctioned during the procedure.   While the scope was in the duodenum and biopsies were being taken the patient began coughing and had a desaturation which did not resolve with supplemental oxygen and airway manipulation so the EGD was aborted and the endoscopy scope was removed.    He mouth was suctioned again after the scope was removed and there was some scant bilious material mixed with saliva seen in the yankauer.  Her saturation then improved to the high 90's.  They patient was then emerged and found to be neurologically intact.  We then proceeded with the colonoscopy, which was aborted due to a poor prep.  In the PACU her lungs were clear and she received an albuterol treatment for coughing.  At time of discharge she was afebrile, her lungs were clear, she had no shortness of breath and she was saturating 98% on room air.  She and her husband were instructed to contact a physician if the patient had any worsening cough, shortness of breath, fever or any other concerns.  The patient was admitted last night for fever and back pain.  This morning, talking with the patient she feels much better, her back pain and throat pain continue to resolve and she has no dyspnea.  I spoke with the patient, her mother and her husband.  I explained to them that it is possible that she could have had a low volume aspiration that lead to bronchospasm during her EGD.  It is somewhat atypical that her symptoms would present this acutely so she may have also had some  unrecognized underlying pulmonary illness pre procedure.   She was informed that if she requires any endoscopy procedure in the future that we feel that if would be safer for her to receive them at [facility] as opposed to [facility] as we have more anesthesia resources at [facility].  Patient and husband voiced understanding.

## 2017-02-18 LAB — HIV ANTIBODY (ROUTINE TESTING W REFLEX): HIV Screen 4th Generation wRfx: NONREACTIVE

## 2017-02-21 ENCOUNTER — Telehealth: Payer: Self-pay

## 2017-02-21 LAB — CULTURE, BLOOD (ROUTINE X 2)
Culture: NO GROWTH
Culture: NO GROWTH
Special Requests: ADEQUATE
Special Requests: ADEQUATE

## 2017-02-23 NOTE — Discharge Summary (Signed)
[employer] - [place] [facility]   PATIENT NAME: Jennifer Framesmanda Gorman    MR#:  [id number]  DATE OF BIRTH:  July 19, 1980  DATE OF ADMISSION:  02/16/2017 ADMITTING PHYSICIAN: Adrian SaranSital Mody, MD  DATE OF DISCHARGE: 02/17/2017 11:03 AM  PRIMARY CARE PHYSICIAN: Lynett GrimesNelson, Sarah M, PA-C   ADMISSION DIAGNOSIS:  HCAP (healthcare-associated pneumonia) [J18.9] Pain of upper abdomen [R10.10] Sepsis, due to unspecified organism (HCC) [A41.9]  DISCHARGE DIAGNOSIS:  Active Problems:   Sepsis (HCC)   SECONDARY DIAGNOSIS:   Past Medical History:  Diagnosis Date  . Cesarean section wound complication   . Depression   . Hyperlipemia      ADMITTING HISTORY  Jennifer Mcintyre  is a 37 y.o. female with a known history of depression who presents today to the emergency room with a chief complaint of midepigastric abdominal pain and fevers. Patient had an EGD today that was aborted due to the patient having low oxygen saturations and started to cough. The hypoxia resolved as the patient's sedation wore off. Patient then went home and at home she developed fever and chills. She went back to the office and she was found to have tachycardia as well as fever. Patient reports prior to this procedure she did not have a fever. There was a concern of microperforations post-perforation given the patient's abdominal pain. CT scan of the abdomen and chest does not show evidence of perforation. ED physician also asked the surgery service to look at the CT scan and the surgeon apparently confirms that there is no evidence of perforation. Lipase level was normal. EGD and colonoscopy were planned today due to patient having chronic diarrhea    HOSPITAL COURSE:   *   Community-acquired versus aspiration pneumonia *  Acute hypoxic respiratory failure *  Chronic abdominal pain *  Sepsis present on admission *  Depression   patient was admitted to medical floor from endoscopy area after patient was noticed to be  hypoxic during the procedure.  Procedure was canceled.  Patient's chest x-ray showed infiltrate.  Started on IV Levaquin.  Initially needed oxygen which has been weaned off the.  Patient's cultures are negative.  She feels back to baseline by the day of discharge.  She will be discharged home to follow up with the primary care physician and reschedule EGD with GI.  CONSULTS OBTAINED:    DRUG ALLERGIES:  No Known Allergies  DISCHARGE MEDICATIONS:   Allergies as of 02/17/2017   No Known Allergies     Medication List    TAKE these medications   buPROPion 300 MG 24 hr tablet Commonly known as:  WELLBUTRIN XL Take 1 tablet by mouth daily.   pravastatin 10 MG tablet Commonly known as:  PRAVACHOL Take 10 mg by mouth daily.   sertraline 100 MG tablet Commonly known as:  ZOLOFT Take 100 mg by mouth daily.     ASK your doctor about these medications   amoxicillin-clavulanate 875-125 MG tablet Commonly known as:  AUGMENTIN Take 1 tablet by mouth 2 (two) times daily for 5 days. Ask about: Should I take this medication?       Today   VITAL SIGNS:  Blood pressure (!) 105/58, pulse 78, temperature 98.7 F (37.1 C), temperature source Oral, resp. rate 16, height 6\' 1"  (1.854 m), weight 104.3 kg (230 lb), SpO2 97 %.  I/O:  No intake or output data in the 24 hours ending 02/23/17 1528  PHYSICAL EXAMINATION:  Physical Exam  GENERAL:  37 y.o.-year-old  patient lying in the bed with no acute distress.  LUNGS: Normal breath sounds bilaterally, no wheezing, rales,rhonchi or crepitation. No use of accessory muscles of respiration.  CARDIOVASCULAR: S1, S2 normal. No murmurs, rubs, or gallops.  ABDOMEN: Soft, non-tender, non-distended. Bowel sounds present. No organomegaly or mass.  NEUROLOGIC: Moves all 4 extremities. PSYCHIATRIC: The patient is alert and oriented x 3.  SKIN: No obvious rash, lesion, or ulcer.   DATA REVIEW:   CBC Recent Labs  Lab 02/17/17 0236  WBC 11.8*  HGB  13.0  HCT 39.9  PLT 254    Chemistries  Recent Labs  Lab 02/16/17 1544 02/17/17 0236  NA 137 138  K 3.9 4.2  CL 107 108  CO2 24 22  GLUCOSE 130* 137*  BUN 11 8  CREATININE 0.91 0.84  CALCIUM 9.3 9.1  AST 34  --   ALT 26  --   ALKPHOS 86  --   BILITOT 0.7  --     Cardiac Enzymes Recent Labs  Lab 02/16/17 1640  TROPONINI <0.03    Microbiology Results  Results for orders placed or performed during the hospital encounter of 02/16/17  Blood Culture (routine x 2)     Status: None   Collection Time: 02/16/17  4:07 PM  Result Value Ref Range Status   Specimen Description BLOOD RIGHT ANTECUBITAL  Final   Special Requests   Final    BOTTLES DRAWN AEROBIC AND ANAEROBIC Blood Culture adequate volume   Culture   Final    NO GROWTH 5 DAYS Performed at [facility], [address], [place], [place] [address]    Report Status 02/21/2017 FINAL  Final  Blood Culture (routine x 2)     Status: None   Collection Time: 02/16/17  4:07 PM  Result Value Ref Range Status   Specimen Description BLOOD RIGHT ANTECUBITAL  Final   Special Requests   Final    BOTTLES DRAWN AEROBIC AND ANAEROBIC Blood Culture adequate volume   Culture   Final    NO GROWTH 5 DAYS Performed at [facility], [address], [place], [place] [address]    Report Status 02/21/2017 FINAL  Final    RADIOLOGY:  No results found.  Follow up with PCP in 1 week.  Management plans discussed with the patient, family and they are in agreement.  CODE STATUS:  Code Status History    Date Active Date Inactive Code Status Order ID Comments User Context   02/16/2017 21:03 02/17/2017 14:40 Full Code [id number]  Adrian Saran, MD Inpatient      TOTAL TIME TAKING CARE OF THIS PATIENT ON DAY OF DISCHARGE: more than 30 minutes.   Molinda Bailiff Josselin Gaulin M.D on 02/23/2017 at 3:28 PM  Between 7am to 6pm - Pager - [phone]  After 6pm go to [email or web address] - password [email or web address]  [facility] [place] [facility]   Office  [phone]  CC: Primary care physician; Lynett Grimes, PA-C  Note: This dictation was prepared with Dragon dictation along with smaller phrase technology. Any transcriptional errors that result from this process are unintentional.

## 2017-02-25 NOTE — Telephone Encounter (Signed)
EMMI Follow-up: Late entry as I was having some technical difficulties on Monday.  On 02/21/17 @ 12:46 pm I talked with the pt. And she had no concerns regarding her discharge.

## 2017-03-22 ENCOUNTER — Ambulatory Visit
Admission: RE | Admit: 2017-03-22 | Discharge: 2017-03-22 | Disposition: A | Discharge status: Home / Self Care | Payer: 59 | Source: Ambulatory Visit | Attending: Medical Oncology | Admitting: Medical Oncology

## 2017-03-22 ENCOUNTER — Other Ambulatory Visit: Payer: Self-pay | Admitting: Medical Oncology

## 2017-03-22 DIAGNOSIS — J189 Pneumonia, unspecified organism: Secondary | ICD-10-CM | POA: Diagnosis not present

## 2017-04-19 ENCOUNTER — Encounter: Admission: RE | Payer: Self-pay | Source: Ambulatory Visit

## 2017-04-19 ENCOUNTER — Ambulatory Visit: Admission: RE | Admit: 2017-04-19 | Payer: 59 | Source: Ambulatory Visit | Admitting: Gastroenterology

## 2017-04-19 SURGERY — COLONOSCOPY WITH PROPOFOL
Anesthesia: General

## 2017-07-28 ENCOUNTER — Other Ambulatory Visit: Payer: Self-pay

## 2017-07-28 ENCOUNTER — Encounter: Payer: Self-pay | Admitting: *Deleted

## 2017-08-04 ENCOUNTER — Ambulatory Visit
Admission: RE | Admit: 2017-08-04 | Discharge: 2017-08-04 | Disposition: A | Discharge status: Home / Self Care | Payer: 59 | Source: Ambulatory Visit | Attending: Otolaryngology | Admitting: Otolaryngology

## 2017-08-04 ENCOUNTER — Ambulatory Visit: Payer: 59 | Admitting: Anesthesiology

## 2017-08-04 ENCOUNTER — Encounter
Admission: RE | Disposition: A | Discharge status: Home / Self Care | Payer: Self-pay | Source: Ambulatory Visit | Attending: Otolaryngology

## 2017-08-04 DIAGNOSIS — F329 Major depressive disorder, single episode, unspecified: Secondary | ICD-10-CM | POA: Diagnosis not present

## 2017-08-04 DIAGNOSIS — J342 Deviated nasal septum: Secondary | ICD-10-CM | POA: Insufficient documentation

## 2017-08-04 DIAGNOSIS — J3503 Chronic tonsillitis and adenoiditis: Secondary | ICD-10-CM | POA: Diagnosis not present

## 2017-08-04 DIAGNOSIS — E7800 Pure hypercholesterolemia, unspecified: Secondary | ICD-10-CM | POA: Diagnosis not present

## 2017-08-04 DIAGNOSIS — Z79899 Other long term (current) drug therapy: Secondary | ICD-10-CM | POA: Insufficient documentation

## 2017-08-04 HISTORY — DX: Other complications of anesthesia, initial encounter: T88.59XA

## 2017-08-04 HISTORY — DX: Presence of spectacles and contact lenses: Z97.3

## 2017-08-04 HISTORY — DX: Adverse effect of unspecified anesthetic, initial encounter: T41.45XA

## 2017-08-04 HISTORY — PX: TONSILLECTOMY AND ADENOIDECTOMY: SHX28

## 2017-08-04 HISTORY — DX: Gastro-esophageal reflux disease without esophagitis: K21.9

## 2017-08-04 HISTORY — PX: SEPTOPLASTY: SHX2393

## 2017-08-04 SURGERY — TONSILLECTOMY AND ADENOIDECTOMY
Anesthesia: General | Site: Throat | Laterality: Bilateral | Wound class: "Clean Contaminated "

## 2017-08-04 MED ORDER — LACTATED RINGERS IV SOLN
INTRAVENOUS | Status: DC
  Administered 2017-08-04: 11:00:00 via INTRAVENOUS

## 2017-08-04 MED ORDER — LIDOCAINE HCL 1 % IJ SOLN
INTRAMUSCULAR | Status: DC | PRN
  Administered 2017-08-04: 15 mL via TOPICAL

## 2017-08-04 MED ORDER — OXYCODONE HCL 5 MG/5ML PO SOLN
5.0000 mg | Freq: Once | ORAL | Status: AC | PRN
  Administered 2017-08-04: 5 mg via ORAL

## 2017-08-04 MED ORDER — PROPOFOL 10 MG/ML IV BOLUS
INTRAVENOUS | Status: DC | PRN
  Administered 2017-08-04: 50 mg via INTRAVENOUS
  Administered 2017-08-04: 140 mg via INTRAVENOUS

## 2017-08-04 MED ORDER — FENTANYL CITRATE (PF) 100 MCG/2ML IJ SOLN
INTRAMUSCULAR | Status: DC | PRN
  Administered 2017-08-04: 25 ug via INTRAVENOUS
  Administered 2017-08-04: 100 ug via INTRAVENOUS

## 2017-08-04 MED ORDER — FENTANYL CITRATE (PF) 100 MCG/2ML IJ SOLN
25.0000 ug | INTRAMUSCULAR | Status: DC | PRN
  Administered 2017-08-04: 25 ug via INTRAVENOUS

## 2017-08-04 MED ORDER — OXYCODONE HCL 5 MG PO TABS: 5.0000 mg | ORAL_TABLET | Freq: Once | ORAL | Status: AC | PRN

## 2017-08-04 MED ORDER — SCOPOLAMINE 1 MG/3DAYS TD PT72
1.0000 | MEDICATED_PATCH | Freq: Once | TRANSDERMAL | Status: DC
  Administered 2017-08-04: 1.5 mg via TRANSDERMAL

## 2017-08-04 MED ORDER — GLYCOPYRROLATE 0.2 MG/ML IJ SOLN
INTRAMUSCULAR | Status: DC | PRN
  Administered 2017-08-04: 0.1 mg via INTRAVENOUS

## 2017-08-04 MED ORDER — DEXTROSE 5 % IV SOLN
2000.0000 mg | Freq: Once | INTRAVENOUS | Status: AC
  Administered 2017-08-04: 2000 mg via INTRAVENOUS

## 2017-08-04 MED ORDER — MIDAZOLAM HCL 5 MG/5ML IJ SOLN
INTRAMUSCULAR | Status: DC | PRN
  Administered 2017-08-04: 2 mg via INTRAVENOUS

## 2017-08-04 MED ORDER — ONDANSETRON HCL 4 MG/2ML IJ SOLN
INTRAMUSCULAR | Status: DC | PRN
  Administered 2017-08-04: 4 mg via INTRAVENOUS

## 2017-08-04 MED ORDER — ONDANSETRON HCL 4 MG/2ML IJ SOLN: 4.0000 mg | Freq: Once | INTRAMUSCULAR | Status: DC | PRN

## 2017-08-04 MED ORDER — DEXAMETHASONE SODIUM PHOSPHATE 4 MG/ML IJ SOLN
INTRAMUSCULAR | Status: DC | PRN
  Administered 2017-08-04: 10 mg via INTRAVENOUS

## 2017-08-04 MED ORDER — LIDOCAINE HCL (CARDIAC) PF 100 MG/5ML IV SOSY
PREFILLED_SYRINGE | INTRAVENOUS | Status: DC | PRN
  Administered 2017-08-04: 50 mg via INTRAVENOUS

## 2017-08-04 MED ORDER — ACETAMINOPHEN 10 MG/ML IV SOLN
1000.0000 mg | Freq: Once | INTRAVENOUS | Status: AC
  Administered 2017-08-04: 1000 mg via INTRAVENOUS

## 2017-08-04 MED ORDER — OXYMETAZOLINE HCL 0.05 % NA SOLN
2.0000 | Freq: Once | NASAL | Status: AC
  Administered 2017-08-04: 2 via NASAL

## 2017-08-04 MED ORDER — LIDOCAINE-EPINEPHRINE 1 %-1:100000 IJ SOLN
INTRAMUSCULAR | Status: DC | PRN
  Administered 2017-08-04: 8 mL

## 2017-08-04 MED ORDER — SUCCINYLCHOLINE CHLORIDE 20 MG/ML IJ SOLN
INTRAMUSCULAR | Status: DC | PRN
  Administered 2017-08-04: 100 mg via INTRAVENOUS

## 2017-08-04 MED ORDER — IBUPROFEN 100 MG/5ML PO SUSP: 600.0000 mg | Freq: Once | ORAL | Status: DC

## 2017-08-04 SURGICAL SUPPLY — 29 items
BLADE BOVIE TIP EXT 4 (BLADE) IMPLANT
CANISTER SUCT 1200ML W/VALVE (MISCELLANEOUS) ×3 IMPLANT
COAGULATOR SUCT 8FR VV (MISCELLANEOUS) ×3 IMPLANT
DRAPE HEAD BAR (DRAPES) ×3 IMPLANT
ELECT REM PT RETURN 9FT ADLT (ELECTROSURGICAL) ×3
ELECTRODE REM PT RTRN 9FT ADLT (ELECTROSURGICAL) ×2 IMPLANT
GLOVE PI ULTRA LF STRL 7.5 (GLOVE) ×4 IMPLANT
GLOVE PI ULTRA NON LATEX 7.5 (GLOVE) ×2
KIT TURNOVER KIT A (KITS) ×3 IMPLANT
NDL ANESTHESIA 27G X 3.5 (NEEDLE) ×2 IMPLANT
NEEDLE ANESTHESIA  27G X 3.5 (NEEDLE) ×1
NEEDLE ANESTHESIA 27G X 3.5 (NEEDLE) ×2 IMPLANT
PACK DRAPE NASAL/ENT (PACKS) ×3 IMPLANT
PATTIES SURGICAL .5 X3 (DISPOSABLE) ×3 IMPLANT
PENCIL SMOKE EVACUATOR (MISCELLANEOUS) ×3 IMPLANT
SLEEVE SUCTION 125 (MISCELLANEOUS) ×3 IMPLANT
SOL ANTI-FOG 6CC FOG-OUT (MISCELLANEOUS) ×2 IMPLANT
SOL FOG-OUT ANTI-FOG 6CC (MISCELLANEOUS) ×1
SPLINT NASAL SEPTAL BLV .50 ST (MISCELLANEOUS) ×3 IMPLANT
SPONGE TONSIL 7/8 RF SGL LF (GAUZE/BANDAGES/DRESSINGS) ×3 IMPLANT
STRAP BODY AND KNEE 60X3 (MISCELLANEOUS) ×3 IMPLANT
SUT CHROMIC 3-0 (SUTURE) ×1
SUT CHROMIC 3-0 KS 27XMFL CR (SUTURE) ×2
SUT ETHILON 3-0 KS 30 BLK (SUTURE) ×3 IMPLANT
SUT PLAIN GUT 4-0 (SUTURE) ×3 IMPLANT
SUTURE CHRMC 3-0 KS 27XMFL CR (SUTURE) IMPLANT
SYR 3ML LL SCALE MARK (SYRINGE) ×3 IMPLANT
TOWEL OR 17X26 4PK STRL BLUE (TOWEL DISPOSABLE) ×3 IMPLANT
WATER STERILE IRR 250ML POUR (IV SOLUTION) ×3 IMPLANT

## 2017-08-04 NOTE — Discharge Instructions (Signed)
T & A INSTRUCTION SHEET - [facility] °[facility] ° °CREIGHTON VAUGHT, MD °PAUL H. JUENGEL, MD  °P. SCOTT BENNETT °CHAPMAN MCQUEEN, MD ° °[address] , [place] [address] TEL. [phone] °[address] [place] [address] [phone] ° °INFORMATION SHEET FOR A TONSILLECTOMY AND ADENDOIDECTOMY ° °About Your Tonsils and Adenoids ° The tonsils and adenoids are normal body tissues that are part of our immune system.  They normally help to protect us against diseases that may enter our mouth and nose.  However, sometimes the tonsils and/or adenoids become too large and obstruct our breathing, especially at night. °  ° If either of these things happen it helps to remove the tonsils and adenoids in order to become healthier. The operation to remove the tonsils and adenoids is called a tonsillectomy and adenoidectomy. ° °The Location of Your Tonsils and Adenoids ° The tonsils are located in the back of the throat on both side and sit in a cradle of muscles. The adenoids are located in the roof of the mouth, behind the nose, and closely associated with the opening of the Eustachian tube to the ear. ° °Surgery on Tonsils and Adenoids ° A tonsillectomy and adenoidectomy is a short operation which takes about thirty minutes.  This includes being put to sleep and being awakened.  Tonsillectomies and adenoidectomies are performed at [facility] and may require observation period in the recovery room prior to going home. ° °Following the Operation for a Tonsillectomy ° A cautery machine is used to control bleeding.  Bleeding from a tonsillectomy and adenoidectomy is minimal and postoperatively the risk of bleeding is approximately four percent, although this rarely life threatening. ° ° ° °After your tonsillectomy and adenoidectomy post-op care at home: ° °1. Our patients are able to go home the same day.  You may be given prescriptions for pain  medications and antibiotics, if indicated. °2. It is extremely important to remember that fluid intake is of utmost importance after a tonsillectomy.  The amount that you drink must be maintained in the postoperative period.  A good indication of whether a child is getting enough fluid is whether his/her urine output is constant.  As long as children are urinating or wetting their diaper every 6 - 8 hours this is usually enough fluid intake.   °3. Although rare, this is a risk of some bleeding in the first ten days after surgery.  This is usually occurs between day five and nine postoperatively.  This risk of bleeding is approximately four percent.  If you or your child should have any bleeding you should remain calm and notify our office or go directly to the Emergency Room at [facility] where they will contact us. Our doctors are available seven days a week for notification.  We recommend sitting up quietly in a chair, place an ice pack on the front of the neck and spitting out the blood gently until we are able to contact you.  Adults should gargle gently with ice water and this may help stop the bleeding.  If the bleeding does not stop after a short time, i.e. 10 to 15 minutes, or seems to be increasing again, please contact us or go to the hospital.   °4. It is common for the pain to be worse at 5 - 7 days postoperatively.  This occurs because the “scab” is peeling off and the mucous membrane (skin of   the throat) is growing back where the tonsils were.   5. It is common for a low-grade fever, less than 102, during the first week after a tonsillectomy and adenoidectomy.  It is usually due to not drinking enough liquids, and we suggest your use liquid Tylenol or the pain medicine with Tylenol prescribed in order to keep your temperature below 102.  Please follow the directions on the back of the bottle. 6. Do not take aspirin or any products that contain aspirin such as Bufferin, Anacin,  Ecotrin, aspirin gum, Goodies, BC headache powders, etc., after a T&A because it can promote bleeding.  Please check with our office before administering any other medication that may been prescribed by other doctors during the two week post-operative period. 7. If you happen to look in the mirror or into your childs mouth you will see white/gray patches on the back of the throat.  This is what a scab looks like in the mouth and is normal after having a T&A.  It will disappear once the tonsil area heals completely. However, it may cause a noticeable odor, and this too will disappear with time.     8. You or your child may experience ear pain after having a T&A.  This is called referred pain and comes from the throat, but it is felt in the ears.  Ear pain is quite common and expected.  It will usually go away after ten days.  There is usually nothing wrong with the ears, and it is primarily due to the healing area stimulating the nerve to the ear that runs along the side of the throat.  Use either the prescribed pain medicine or Tylenol as needed.  9. The throat tissues after a tonsillectomy are obviously sensitive.  Smoking around children who have had a tonsillectomy significantly increases the risk of bleeding.  DO NOT SMOKE!    [facility] ENDOSCOPIC SINUS SURGERY [facility]  What is Functional Endoscopic Sinus Surgery?  The Surgery involves making the natural openings of the sinuses larger by removing the bony partitions that separate the sinuses from the nasal cavity.  The natural sinus lining is preserved as much as possible to allow the sinuses to resume normal function after the surgery.  In some patients nasal polyps (excessively swollen lining of the sinuses) may be removed to relieve obstruction of the sinus openings.  The surgery is performed through the nose using lighted scopes, which eliminates the need for incisions on  the face.  A septoplasty is a different procedure which is sometimes performed with sinus surgery.  It involves straightening the boy partition that separates the two sides of your nose.  A crooked or deviated septum may need repair if is obstructing the sinuses or nasal airflow.  Turbinate reduction is also often performed during sinus surgery.  The turbinates are bony proturberances from the side walls of the nose which swell and can obstruct the nose in patients with sinus and allergy problems.  Their size can be surgically reduced to help relieve nasal obstruction.  What Can Sinus Surgery Do For Me?  Sinus surgery can reduce the frequency of sinus infections requiring antibiotic treatment.  This can provide improvement in nasal congestion, post-nasal drainage, facial pressure and nasal obstruction.  Surgery will NOT prevent you from ever having an infection again, so it usually only for patients who get infections 4 or more times yearly requiring antibiotics, or for infections  that do not clear with antibiotics.  It will not cure nasal allergies, so patients with allergies may still require medication to treat their allergies after surgery. Surgery may improve headaches related to sinusitis, however, some people will continue to require medication to control sinus headaches related to allergies.  Surgery will do nothing for other forms of headache (migraine, tension or cluster).  What Are the Risks of Endoscopic Sinus Surgery?  Current techniques allow surgery to be performed safely with little risk, however, there are rare complications that patients should be aware of.  Because the sinuses are located around the eyes, there is risk of eye injury, including blindness, though again, this would be quite rare. This is usually a result of bleeding behind the eye during surgery, which puts the vision oat risk, though there are treatments to protect the vision and prevent permanent disrupted by surgery causing  a leak of the spinal fluid that surrounds the brain.  More serious complications would include bleeding inside the brain cavity or damage to the brain.  Again, all of these complications are uncommon, and spinal fluid leaks can be safely managed surgically if they occur.  The most common complication of sinus surgery is bleeding from the nose, which may require packing or cauterization of the nose.  Continued sinus have polyps may experience recurrence of the polyps requiring revision surgery.  Alterations of sense of smell or injury to the tear ducts are also rare complications.   What is the Surgery Like, and what is the Recovery?  The Surgery usually takes a couple of hours to perform, and is usually performed under a general anesthetic (completely asleep).  Patients are usually discharged home after a couple of hours.  Sometimes during surgery it is necessary to pack the nose to control bleeding, and the packing is left in place for 24 - 48 hours, and removed by your surgeon.  If a septoplasty was performed during the procedure, there is often a splint placed which must be removed after 5-7 days.   Discomfort: Pain is usually mild to moderate, and can be controlled by prescription pain medication or acetaminophen (Tylenol).  Aspirin, Ibuprofen (Advil, Motrin), or Naprosyn (Aleve) should be avoided, as they can cause increased bleeding.  Most patients feel sinus pressure like they have a bad head cold for several days.  Sleeping with your head elevated can help reduce swelling and facial pressure, as can ice packs over the face.  A humidifier may be helpful to keep the mucous and blood from drying in the nose.   Diet: There are no specific diet restrictions, however, you should generally start with clear liquids and a light diet of bland foods because the anesthetic can cause some nausea.  Advance your diet depending on how your stomach feels.  Taking your pain medication with food will often help reduce  stomach upset which pain medications can cause.  Nasal Saline Irrigation: It is important to remove blood clots and dried mucous from the nose as it is healing.  This is done by having you irrigate the nose at least 3 - 4 times daily with a salt water solution.  We recommend using NeilMed Sinus Rinse (available at the drug store).  Fill the squeeze bottle with the solution, bend over a sink, and insert the tip of the squeeze bottle into the nose  of an inch.  Point the tip of the squeeze bottle towards the inside corner of the eye on the same side your  irrigating.  Squeeze the bottle and gently irrigate the nose.  If you bend forward as you do this, most of the fluid will flow back out of the nose, instead of down your throat.   The solution should be warm, near body temperature, when you irrigate.   Each time you irrigate, you should use a full squeeze bottle.   Note that if you are instructed to use Nasal Steroid Sprays at any time after your surgery, irrigate with saline BEFORE using the steroid spray, so you do not wash it all out of the nose. Another product, Nasal Saline Gel (such as AYR Nasal Saline Gel) can be applied in each nostril 3 - 4 times daily to moisture the nose and reduce scabbing or crusting.  Bleeding:  Bloody drainage from the nose can be expected for several days, and patients are instructed to irrigate their nose frequently with salt water to help remove mucous and blood clots.  The drainage may be dark red or brown, though some fresh blood may be seen intermittently, especially after irrigation.  Do not blow you nose, as bleeding may occur. If you must sneeze, keep your mouth open to allow air to escape through your mouth.  If heavy bleeding occurs: Irrigate the nose with saline to rinse out clots, then spray the nose 3 - 4 times with Afrin Nasal Decongestant Spray.  The spray will constrict the blood vessels to slow bleeding.  Pinch the lower half of your nose shut to apply  pressure, and lay down with your head elevated.  Ice packs over the nose may help as well. If bleeding persists despite these measures, you should notify your doctor.  Do not use the Afrin routinely to control nasal congestion after surgery, as it can result in worsening congestion and may affect healing.     Activity: Return to work varies among patients. Most patients will be out of work at least 5 - 7 days to recover.  Patient may return to work after they are off of narcotic pain medication, and feeling well enough to perform the functions of their job.  Patients must avoid heavy lifting (over 10 pounds) or strenuous physical for 2 weeks after surgery, so your employer may need to assign you to light duty, or keep you out of work longer if light duty is not possible.  NOTE: you should not drive, operate dangerous machinery, do any mentally demanding tasks or make any important legal or financial decisions while on narcotic pain medication and recovering from the general anesthetic.    Call Your Doctor Immediately if You Have Any of the Following: 1. Bleeding that you cannot control with the above measures 2. Loss of vision, double vision, bulging of the eye or black eyes. 3. Fever over 101 degrees 4. Neck stiffness with severe headache, fever, nausea and change in mental state. You are always encourage to call anytime with concerns, however, please call with requests for pain medication refills during office hours.  Office Endoscopy: During follow-up visits your doctor will remove any packing or splints that may have been placed and evaluate and clean your sinuses endoscopically.  Topical anesthetic will be used to make this as comfortable as possible, though you may want to take your pain medication prior to the visit.  How often this will need to be done varies from patient to patient.  After complete recovery from the surgery, you may need follow-up endoscopy from time to time, particularly if  there is  concern of recurrent infection or nasal polyps.  Scopolamine skin patches REMOVE PATCH IN 72 HOURS AND WASH HANDS IMMEDIATELY What is this medicine? SCOPOLAMINE (skoe POL a meen) is used to prevent nausea and vomiting caused by motion sickness, anesthesia and surgery. This medicine may be used for other purposes; ask your health care provider or pharmacist if you have questions. COMMON BRAND NAME(S): Transderm Scop What should I tell my health care provider before I take this medicine? They need to know if you have any of these conditions: -glaucoma -kidney or liver disease -an unusual or allergic reaction (especially skin allergy) to scopolamine, atropine, other medicines, foods, dyes, or preservatives -pregnant or trying to get pregnant -breast-feeding How should I use this medicine? This medicine is for external use only. Follow the directions on the prescription label. One patch contains enough medicine to prevent motion sickness for up to 3 days. Apply the patch at least 4 hours before you need it and only wear one disc at a time. Choose an area behind the ear, that is clean, dry, hairless and free from any cuts or irritation. Wipe the area with a clean dry tissue. Peel off the plastic backing of the skin patch, trying not to touch the adhesive side with your hands. Do not cut the patches. Firmly apply to the area you have chosen, with the metallic side of the patch to the skin and the tan-colored side showing. Once firmly in place, wash your hands well with soap and water. Remove the disc after 3 days, or sooner if you no longer need it. After removing the patch, wash your hands and the area behind your ear thoroughly with soap and water. The patch will still contain some medicine after use. To avoid accidental contact or ingestion by children or pets, fold the used patch in half with the sticky side together and throw away in the trash out of the reach of children and pets. If you  need to use a second patch after you remove the first, place it behind the other ear. Talk to your pediatrician regarding the use of this medicine in children. Special care may be needed. Overdosage: If you think you have taken too much of this medicine contact a poison control center or emergency room at once. NOTE: This medicine is only for you. Do not share this medicine with others. What if I miss a dose? Make sure you apply the patch at least 4 hours before you need it. You can apply it the night before traveling. What may interact with this medicine? -benztropine -bethanechol -medicines for anxiety or sleeping problems like diazepam or temazepam -medicines for hay fever and other allergies -medicines for mental depression -muscle relaxants This list may not describe all possible interactions. Give your health care provider a list of all the medicines, herbs, non-prescription drugs, or dietary supplements you use. Also tell them if you smoke, drink alcohol, or use illegal drugs. Some items may interact with your medicine. What should I watch for while using this medicine? Keep the patch dry, if possible, to prevent it from falling off. Limited contact with water, however, as in bathing or swimming, will not affect the system. If the patch falls off, throw it away and put a new one behind the other ear. You may get drowsy or dizzy. Do not drive, use machinery, or do anything that needs mental alertness until you know how this medicine affects you. Do not stand or sit up quickly, especially if  you are an older patient. This reduces the risk of dizzy or fainting spells. Alcohol may interfere with the effect of this medicine. Avoid alcoholic drinks. Your mouth may get dry. Chewing sugarless gum or sucking hard candy, and drinking plenty of water may help. Contact your doctor if the problem does not go away or is severe. This medicine may cause dry eyes and blurred vision. If you wear contact  lenses you may feel some discomfort. Lubricating drops may help. See your eye doctor if the problem does not go away or is severe. If you are going to have a magnetic resonance imaging (MRI) procedure, tell your MRI technician if you have this patch on your body. It must be removed before a MRI. What side effects may I notice from receiving this medicine? Side effects that you should report to your doctor or health care professional as soon as possible: -agitation, nervousness, confusion -blurred vision and other eye problems -dizziness, drowsiness -eye pain or redness in the whites of the eye -hallucinations -pain or difficulty passing urine -skin rash, itching -vomiting Side effects that usually do not require medical attention (report to your doctor or health care professional if they continue or are bothersome): -headache -nausea This list may not describe all possible side effects. Call your doctor for medical advice about side effects. You may report side effects to FDA at 1-800-FDA-1088. Where should I keep my medicine? Keep out of the reach of children. Store at room temperature between 20 and 25 degrees C (68 and 77 degrees F). Throw away any unused medicine after the expiration date. When you remove a patch, fold it and throw it in the trash as described above. NOTE: This sheet is a summary. It may not cover all possible information. If you have questions about this medicine, talk to your doctor, pharmacist, or health care provider.  2018 Elsevier/Gold Standard (2011-05-20 13:31:48)  General Anesthesia, Adult, Care After These instructions provide you with information about caring for yourself after your procedure. Your health care provider may also give you more specific instructions. Your treatment has been planned according to current medical practices, but problems sometimes occur. Call your health care provider if you have any problems or questions after your procedure. What  can I expect after the procedure? After the procedure, it is common to have:  Vomiting.  A sore throat.  Mental slowness.  It is common to feel:  Nauseous.  Cold or shivery.  Sleepy.  Tired.  Sore or achy, even in parts of your body where you did not have surgery.  Follow these instructions at home: For at least 24 hours after the procedure:  Do not: ? Participate in activities where you could fall or become injured. ? Drive. ? Use heavy machinery. ? Drink alcohol. ? Take sleeping pills or medicines that cause drowsiness. ? Make important decisions or sign legal documents. ? Take care of children on your own.  Rest. Eating and drinking  If you vomit, drink water, juice, or soup when you can drink without vomiting.  Drink enough fluid to keep your urine clear or pale yellow.  Make sure you have little or no nausea before eating solid foods.  Follow the diet recommended by your health care provider. General instructions  Have a responsible adult stay with you until you are awake and alert.  Return to your normal activities as told by your health care provider. Ask your health care provider what activities are safe for you.  Take  over-the-counter and prescription medicines only as told by your health care provider.  If you smoke, do not smoke without supervision.  Keep all follow-up visits as told by your health care provider. This is important. Contact a health care provider if:  You continue to have nausea or vomiting at home, and medicines are not helpful.  You cannot drink fluids or start eating again.  You cannot urinate after 8-12 hours.  You develop a skin rash.  You have fever.  You have increasing redness at the site of your procedure. Get help right away if:  You have difficulty breathing.  You have chest pain.  You have unexpected bleeding.  You feel that you are having a life-threatening or urgent problem. This information is not  intended to replace advice given to you by your health care provider. Make sure you discuss any questions you have with your health care provider. Document Released: 03/29/2000 Document Revised: 05/26/2015 Document Reviewed: 12/05/2014 Elsevier Interactive Patient Education  [employer].

## 2017-08-04 NOTE — Anesthesia Postprocedure Evaluation (Signed)
Anesthesia Post Note  Patient: Jennifer Mcintyre  Procedure(s) Performed: TONSILLECTOMY (Bilateral Throat) SEPTOPLASTY (Bilateral Nose)  Patient location during evaluation: PACU Anesthesia Type: General Level of consciousness: awake and alert and oriented Pain management: satisfactory to patient Vital Signs Assessment: post-procedure vital signs reviewed and stable Respiratory status: spontaneous breathing, nonlabored ventilation and respiratory function stable Cardiovascular status: blood pressure returned to baseline and stable Postop Assessment: Adequate PO intake and No signs of nausea or vomiting Anesthetic complications: no    Cherly BeachStella, Callie Bunyard J

## 2017-08-04 NOTE — Op Note (Signed)
08/04/2017  1:28 PM [id number]   Pre-Op Dx:  Deviated Nasal Septum, chronic tonsillitis, hypertrophied tonsils  Post-op Dx: Same  Proc: Nasal Septoplasty, outfracture left inferior turbinate, tonsillectomy  Surg:  Jennifer Mcintyre  Anes:  GOT  EBL: 75 mL  Comp: None  Findings: Septum was markedly deviated to the right side with the vomer there and the maxillary crest buckled to the side.  The corrected vomer and maxillary crest pulled on the septal cartilage to the right also.  The left inferior turbinate was overgrown.  The tonsils were very enlarged and very cryptic.  There is no adenoid tissue noted.    Procedure: With the patient in a comfortable supine position,  general orotracheal anesthesia was induced without difficulty.     The patient received preoperative Afrin spray for topical decongestion and vasoconstriction.  Intravenous prophylactic antibiotics were administered.  At an appropriate level, the patient was placed in a semi-sitting position.  Nasal vibrissae were trimmed.   1% Xylocaine with 1:100,000 epinephrine, 7 cc's, was infiltrated into the anterior floor of the nose, into the nasal spine region, into the membranous columella, and finally into the submucoperichondrial plane of the septum on both sides.  Several minutes were allowed for this to take effect.  Cottoniod pledgetts soaked in Afrin and 4% Xylocaine were placed into both nasal cavities and left while the patient was prepped and draped in the standard fashion.  The materials were removed from the nose and observed to be intact and correct in number.  The nose was inspected with a headlight and the 0 degrees scope with the findings as described above.  A hemitransfixion incision was sharply executed and carried down to the caudal edge of the quadrangular cartilage. The mucoperichondrium was elelvated along the quadrangular plate on both sides back to the bony-cartilaginous junction. The mucoperiostium was then  elevated along the ethmoid plate and the vomer. The boney-catilaginous junction was then split with a freer elevator. The mucoperiosteum was then elevated along the maxillary crest as needed to expose the crooked bone of the crest.  Boney spurs of the vomer and maxillary crest were removed with Lenoria Chimeakahashi forceps.  There was a tear of the mucosa on the right side where the maxillary crest spur was.  A small amount of cartilage was overhanging the inferior nasal spine on the right side that needed to be trimmed.  The cartilaginous plate was trimmed along its posterior and inferior borders of about 2 mm of cartilage to free it up inferiorly. Some of the deviated ethmoid plate was then fractured and removed with Takahashi forceps to free up the posterior border of the quadrangular plate and allow it to swing back to the midline. The mucosal flaps were placed back into their anatomic position to allow visualization of the airways. The septum now sat in the midline with an improved airway.  A 3-0 Chromic suture on a Keith needle in used to anchor the inferior septum at the nasal spine with a through and through suture. The mucosal flaps are then sutured together using a through and through whip stitch of 4-0 Plain Gut with a mini-Keith needle.  This was used to close the hemitransfixion incision as well.   The inferior turbinates were then inspected.  The left inferior turbinate was outfractured using a Careers information officerBoise elevator.  The right side was already lateral because of the spur pushing into it so it did not need to be addressed.   The airways were then visualized and  showed open passageways on both sides that were significantly improved compared to before surgery. There was no signifcant bleeding. Nasal splints were applied to both sides of the septum using Xomed 0.27mm regular sized splints that were trimmed, and then held in position with a 3-0 Nylon through and through suture.  0 degrees scope was then placed  through the nose and both sides and splints were in good position.  The septum was straight and the airways were open on both sides.  No packing was placed.  Patient had was put into slightly Trendelenburg position and a Vernelle Emerald was used to visualize the oropharynx.  The tonsils were very large and cryptic and buried beneath the anterior tonsillar pillar.  There were plenty crevices for debris.  The left tonsil was grasped pulled medially.  The anterior pillar incised with electrocautery.  The tonsil was dissected from its fossa using blunt dissection and electrocautery.  There is significant bleeding from multiple vessels that were cauterized as there is a rich vascular supply to these very large tonsils.  Bleeding was completely controlled using direct pressure and electrocautery.  The right side was then addressed in a similar fashion removing the tonsil and making sure all the bleeding was controlled.  The airway was now much more open.  The nasopharynx was evaluated and there is no significant adenoid tissue here at all.  The back of the nose is clear and there is no sign of any bleeding from the nose.  The patient was turned back over to anesthesia, and awakened, extubated, and taken to the PACU in satisfactory condition.  Dispo:   PACU to home  Plan: Ice, elevation, narcotic analgesia, steroid taper, and prophylactic antibiotics for the duration of indwelling nasal foreign bodies.  We will reevaluate the patient in the office in 7 days and remove the septal splints.  Return to work in 10 days, strenuous activities in two weeks.   Jennifer Sessions Jennifer Mcintyre 08/04/2017 1:28 PM

## 2017-08-04 NOTE — Anesthesia Procedure Notes (Signed)
Procedure Name: Intubation Date/Time: 08/04/2017 12:00 PM Performed by: Jimmy PicketAmyot, Taryll Reichenberger, CRNA Pre-anesthesia Checklist: Patient identified, Emergency Drugs available, Suction available, Patient being monitored and Timeout performed Patient Re-evaluated:Patient Re-evaluated prior to induction Oxygen Delivery Method: Circle system utilized Preoxygenation: Pre-oxygenation with 100% oxygen Induction Type: IV induction Ventilation: Mask ventilation without difficulty Laryngoscope Size: Miller and 2 Grade View: Grade I Tube type: Oral Rae Tube size: 7.5 mm Number of attempts: 1 Placement Confirmation: ETT inserted through vocal cords under direct vision,  positive ETCO2 and breath sounds checked- equal and bilateral Tube secured with: Tape Dental Injury: Teeth and Oropharynx as per pre-operative assessment

## 2017-08-04 NOTE — H&P (Signed)
H&P has been reviewedand patient reevaluated,  and no changes necessary. To be downloaded later.  

## 2017-08-04 NOTE — Anesthesia Preprocedure Evaluation (Signed)
Anesthesia Evaluation  Patient identified by MRN, date of birth, ID band Patient awake    Reviewed: Allergy & Precautions, H&P , NPO status , Patient's Chart, lab work & pertinent test results  History of Anesthesia Complications (+) history of anesthetic complications  Airway Mallampati: II  TM Distance: >3 FB Neck ROM: full    Dental no notable dental hx.    Pulmonary    Pulmonary exam normal breath sounds clear to auscultation       Cardiovascular Normal cardiovascular exam Rhythm:regular Rate:Normal     Neuro/Psych PSYCHIATRIC DISORDERS    GI/Hepatic GERD  ,  Endo/Other    Renal/GU      Musculoskeletal   Abdominal   Peds  Hematology   Anesthesia Other Findings   Reproductive/Obstetrics                             Anesthesia Physical Anesthesia Plan  ASA: II  Anesthesia Plan: General   Post-op Pain Management:    Induction:   PONV Risk Score and Plan: 3 and Ondansetron, Dexamethasone, Scopolamine patch - Pre-op and Treatment may vary due to age or medical condition  Airway Management Planned: Oral ETT  Additional Equipment:   Intra-op Plan:   Post-operative Plan:   Informed Consent: I have reviewed the patients History and Physical, chart, labs and discussed the procedure including the risks, benefits and alternatives for the proposed anesthesia with the patient or authorized representative who has indicated his/her understanding and acceptance.     Plan Discussed with: CRNA  Anesthesia Plan Comments:         Anesthesia Quick Evaluation

## 2017-08-04 NOTE — Transfer of Care (Signed)
Immediate Anesthesia Transfer of Care Note  Patient: Jennifer Mcintyre  Procedure(s) Performed: TONSILLECTOMY (Bilateral Throat) SEPTOPLASTY (Bilateral Nose)  Patient Location: PACU  Anesthesia Type: General  Level of Consciousness: awake, alert  and patient cooperative  Airway and Oxygen Therapy: Patient Spontanous Breathing and Patient connected to supplemental oxygen  Post-op Assessment: Post-op Vital signs reviewed, Patient's Cardiovascular Status Stable, Respiratory Function Stable, Patent Airway and No signs of Nausea or vomiting  Post-op Vital Signs: Reviewed and stable  Complications: No apparent anesthesia complications

## 2017-08-08 LAB — SURGICAL PATHOLOGY

## 2017-12-14 ENCOUNTER — Encounter: Payer: Self-pay | Admitting: *Deleted

## 2017-12-15 ENCOUNTER — Ambulatory Visit: Admission: RE | Admit: 2017-12-15 | Payer: 59 | Source: Ambulatory Visit | Admitting: Gastroenterology

## 2017-12-15 ENCOUNTER — Encounter: Payer: Self-pay | Admitting: Anesthesiology

## 2017-12-15 ENCOUNTER — Encounter: Admission: RE | Payer: Self-pay | Source: Ambulatory Visit

## 2017-12-15 HISTORY — DX: Dermatitis, unspecified: L30.9

## 2017-12-15 HISTORY — DX: Scoliosis, unspecified: M41.9

## 2017-12-15 HISTORY — DX: Hyperlipidemia, unspecified: E78.5

## 2017-12-15 HISTORY — DX: Hidradenitis suppurativa: L73.2

## 2017-12-15 SURGERY — COLONOSCOPY WITH PROPOFOL
Anesthesia: General

## 2017-12-15 MED ORDER — LIDOCAINE HCL (PF) 2 % IJ SOLN
INTRAMUSCULAR | Status: AC
  Filled 2017-12-15: qty 10

## 2017-12-15 MED ORDER — PROPOFOL 500 MG/50ML IV EMUL
INTRAVENOUS | Status: AC
  Filled 2017-12-15: qty 50

## 2017-12-15 MED ORDER — MIDAZOLAM HCL 2 MG/2ML IJ SOLN
INTRAMUSCULAR | Status: AC
  Filled 2017-12-15: qty 2

## 2017-12-15 MED ORDER — FENTANYL CITRATE (PF) 100 MCG/2ML IJ SOLN
INTRAMUSCULAR | Status: AC
  Filled 2017-12-15: qty 2

## 2018-01-03 ENCOUNTER — Other Ambulatory Visit: Payer: Self-pay

## 2018-01-03 ENCOUNTER — Ambulatory Visit
Admission: EM | Admit: 2018-01-03 | Discharge: 2018-01-03 | Disposition: A | Discharge status: Home / Self Care | Payer: 59 | Attending: Family Medicine | Admitting: Family Medicine

## 2018-01-03 ENCOUNTER — Encounter: Payer: Self-pay | Admitting: Emergency Medicine

## 2018-01-03 DIAGNOSIS — J069 Acute upper respiratory infection, unspecified: Secondary | ICD-10-CM

## 2018-01-03 DIAGNOSIS — B9789 Other viral agents as the cause of diseases classified elsewhere: Secondary | ICD-10-CM | POA: Diagnosis not present

## 2018-01-03 LAB — RAPID INFLUENZA A&B ANTIGENS
Influenza A (ARMC): NEGATIVE
Influenza B (ARMC): NEGATIVE

## 2018-01-03 NOTE — ED Provider Notes (Addendum)
[facility]    CSN: [id number] Arrival date & time: 01/03/18  1225     History   Chief Complaint Chief Complaint  Patient presents with  . Cough    APPT    HPI Jennifer Mcintyre is a 37 y.o. female.   The history is provided by the patient.  Cough  Associated symptoms: fever and rhinorrhea   Associated symptoms: no wheezing   URI  Presenting symptoms: congestion, cough, fever and rhinorrhea   Severity:  Moderate Onset quality:  Sudden Timing:  Constant Progression:  Worsening Chronicity:  New Ineffective treatments:  None tried Associated symptoms: no arthralgias and no wheezing   Risk factors: sick contacts     Past Medical History:  Diagnosis Date  . Cesarean section wound complication   . Complication of anesthesia    laryngospasm and aspiration during EGD 02/16/2017  . Depression   . Eczema   . Elevated lipids   . GERD (gastroesophageal reflux disease)   . Hidradenitis   . Hyperlipemia   . Scoliosis   . Wears contact lenses     Patient Active Problem List   Diagnosis Date Noted  . Sepsis ([facility]) 02/16/2017    Past Surgical History:  Procedure Laterality Date  . ABDOMINAL HYSTERECTOMY    . CHOLECYSTECTOMY    . COLONOSCOPY    . SEPTOPLASTY Bilateral 08/04/2017   Procedure: SEPTOPLASTY;  Surgeon: Vernie MurdersJuengel, Paul, MD;  [facility];  Service: ENT;  Laterality: Bilateral;  . TONSILLECTOMY AND ADENOIDECTOMY Bilateral 08/04/2017   Procedure: TONSILLECTOMY;  Surgeon: Vernie MurdersJuengel, Paul, MD;  [facility];  Service: ENT;  Laterality: Bilateral;    OB History   No obstetric history on file.      Home Medications    Prior to Admission medications   Medication Sig Start Date End Date Taking? Authorizing Provider  atorvastatin (LIPITOR) 10 MG tablet Take 1 tablet by mouth daily. 12/08/17 12/08/18 Yes [provider]  buPROPion (WELLBUTRIN XL) 150 MG 24 hr tablet Take 150 mg by mouth daily.    [provider]  Multiple Vitamins-Minerals (ZINC PO) Take by mouth daily.    [provider]  omeprazole (PRILOSEC) 20 MG capsule Take 20 mg by mouth daily.    [provider]  pravastatin (PRAVACHOL) 10 MG tablet Take 10 mg by mouth daily.    [provider]  traZODone (DESYREL) 50 MG tablet Take 50 mg by mouth at bedtime.    [provider]  vortioxetine HBr (TRINTELLIX) 20 MG TABS tablet Take 20 mg by mouth daily.    [provider]    Family History Family History  Problem Relation Age of Onset  . Hyperlipidemia Mother   . Healthy Father        passed at age 36    Social History Social History   Tobacco Use  . Smoking status: Never Smoker  . Smokeless tobacco: Never Used  Substance Use Topics  . Alcohol use: Not Currently  . Drug use: No     Allergies   Patient has no known allergies.   Review of Systems Review of Systems  Constitutional: Positive for fever.  HENT: Positive for congestion and rhinorrhea.   Respiratory: Positive for cough. Negative for wheezing.   Musculoskeletal: Negative for arthralgias.     Physical Exam Triage Vital Signs ED Triage Vitals  Enc Vitals Group     BP 01/03/18 1306 (!) 115/98     Pulse Rate 01/03/18 1306  87     Resp 01/03/18 1306 16     Temp 01/03/18 1306 98 F (36.7 C)     Temp Source 01/03/18 1306 Oral     SpO2 01/03/18 1306 99 %     Weight 01/03/18 1306 240 lb (108.9 kg)     Height 01/03/18 1306 6\' 1"  (1.854 m)     Head Circumference --      Peak Flow --      Pain Score 01/03/18 1305 0     Pain Loc --      Pain Edu? --      Excl. in GC? --    No data found.  Updated Vital Signs BP (!) 115/98 (BP Location: Left Arm)   Pulse 87   Temp 98 F (36.7 C) (Oral)   Resp 16   Ht 6\' 1"  (1.854 m)   Wt 108.9 kg   SpO2 99%   BMI 31.66 kg/m   Visual Acuity Right Eye Distance:   Left Eye Distance:   Bilateral Distance:    Right Eye Near:   Left Eye Near:    Bilateral Near:      Physical Exam Vitals signs and nursing note reviewed.  Constitutional:      General: She is not in acute distress.    Appearance: She is well-developed. She is not toxic-appearing or diaphoretic.  HENT:     Head: Normocephalic and atraumatic.     Right Ear: Tympanic membrane, ear canal and external ear normal.     Left Ear: Tympanic membrane, ear canal and external ear normal.     Nose: Mucosal edema and rhinorrhea present. No nasal deformity, septal deviation or laceration.     Right Sinus: Maxillary sinus tenderness and frontal sinus tenderness present.     Left Sinus: Maxillary sinus tenderness and frontal sinus tenderness present.     Mouth/Throat:     Pharynx: Uvula midline. No oropharyngeal exudate.  Eyes:     General: No scleral icterus.       Right eye: No discharge.        Left eye: No discharge.  Neck:     Musculoskeletal: Neck supple.     Thyroid: No thyromegaly.  Cardiovascular:     Rate and Rhythm: Normal rate and regular rhythm.     Heart sounds: Normal heart sounds.  Pulmonary:     Effort: Pulmonary effort is normal. No respiratory distress.     Breath sounds: Normal breath sounds. No stridor. No wheezing, rhonchi or rales.  Neurological:     Mental Status: She is alert.      UC Treatments / Results  Labs (all labs ordered are listed, but only abnormal results are displayed) Labs Reviewed  RAPID INFLUENZA A&B ANTIGENS ([facility] ONLY)    EKG None  Radiology No results found.  Procedures Procedures (including critical care time)  Medications Ordered in UC Medications - No data to display  Initial Impression / Assessment and Plan / UC Course  I have reviewed the triage vital signs and the nursing notes.  Pertinent labs & imaging results that were available during my care of the patient were reviewed by me and considered in my medical decision making (see chart for details).      Final Clinical Impressions(s) / UC Diagnoses   Final diagnoses:   Viral URI with cough    ED Prescriptions    None     1. Lab results and diagnosis reviewed with patient 2. Recommend supportive  treatment with otc cold/cough meds prn, rest, fluids 3. Follow-up prn if symptoms worsen or don't improve  Controlled Substance Prescriptions [place] Controlled Substance Registry consulted? Not Applicable   Payton Mccallum, MD 01/03/18 1437    Payton Mccallum, MD 01/03/18 [phone]

## 2018-01-03 NOTE — ED Triage Notes (Addendum)
Patient in today c/o cough x 6 days and fever (102) on 12/28/17. Patient states cough continues, fever 99-100 now. Patient had pneumonia 10 months ago. Patient has used OTC Motrin. Patient has requested a flu swab.

## 2019-05-03 ENCOUNTER — Other Ambulatory Visit: Payer: Self-pay

## 2019-05-03 ENCOUNTER — Ambulatory Visit
Admission: EM | Admit: 2019-05-03 | Discharge: 2019-05-03 | Disposition: A | Discharge status: Home / Self Care | Payer: 59 | Attending: Family Medicine | Admitting: Family Medicine

## 2019-05-03 DIAGNOSIS — N3001 Acute cystitis with hematuria: Secondary | ICD-10-CM | POA: Insufficient documentation

## 2019-05-03 LAB — URINALYSIS, COMPLETE (UACMP) WITH MICROSCOPIC
Bilirubin Urine: NEGATIVE
Glucose, UA: NEGATIVE mg/dL
Ketones, ur: NEGATIVE mg/dL
Nitrite: NEGATIVE
Protein, ur: 30 mg/dL — AB
Specific Gravity, Urine: 1.025 (ref 1.005–1.030)
WBC, UA: >50 WBC/hpf (ref 0–5)
pH: 7 (ref 5.0–8.0)

## 2019-05-03 MED ORDER — CEPHALEXIN 500 MG PO CAPS: 500.0000 mg | ORAL_CAPSULE | Freq: Two times a day (BID) | ORAL | 0 refills | Status: DC

## 2019-05-03 MED ORDER — CEFTRIAXONE SODIUM 1 G IJ SOLR
1.0000 g | Freq: Once | INTRAMUSCULAR | Status: AC
  Administered 2019-05-03: 1 g via INTRAMUSCULAR

## 2019-05-03 NOTE — ED Triage Notes (Signed)
Pt reports she got back from [place] on Tuesday. Having dysuria, blood on toilet tissue, frequency and urgency

## 2019-05-03 NOTE — Discharge Instructions (Signed)
Medication as prescribed.  Take care  Dr. Nash Bolls  

## 2019-05-03 NOTE — ED Provider Notes (Signed)
[facility]    CSN: [id number] Arrival date & time: 05/03/19  1052      History   Chief Complaint Chief Complaint  Patient presents with  . Dysuria   HPI  39 year old female presents with suspected UTI.  Patient recently [place] from [place]  Patient reports that she developed symptoms on Tuesday.  She reports dysuria, frequency, urgency.  She reports blood when wiping.  No back pain, flank pain, abdominal pain.  No fever.  No medications or interventions tried.  No relieving factors.  No other complaints.  Past Medical History:  Diagnosis Date  . Cesarean section wound complication   . Complication of anesthesia    laryngospasm and aspiration during EGD 02/16/2017  . Depression   . Eczema   . Elevated lipids   . GERD (gastroesophageal reflux disease)   . Hidradenitis   . Hyperlipemia   . Scoliosis   . Wears contact lenses     Patient Active Problem List   Diagnosis Date Noted  . Sepsis ([place]) 02/16/2017    Past Surgical History:  Procedure Laterality Date  . ABDOMINAL HYSTERECTOMY    . CHOLECYSTECTOMY    . COLONOSCOPY    . SEPTOPLASTY Bilateral 08/04/2017   Procedure: SEPTOPLASTY;  Surgeon: Margaretha Sheffield, MD;  Location: [place];  Service: ENT;  Laterality: Bilateral;  . TONSILLECTOMY AND ADENOIDECTOMY Bilateral 08/04/2017   Procedure: TONSILLECTOMY;  Surgeon: Margaretha Sheffield, MD;  Location: [place];  Service: ENT;  Laterality: Bilateral;    OB History   No obstetric history on file.      Home Medications    Prior to Admission medications   Medication Sig Start Date End Date Taking? Authorizing Provider  atorvastatin (LIPITOR) 10 MG tablet Take 1 tablet by mouth daily. 12/08/17 12/08/18  [provider]  buPROPion (WELLBUTRIN XL) 150 MG 24 hr tablet Take 150 mg by mouth daily.    [provider]  cephALEXin (KEFLEX) 500 MG capsule Take 1 capsule (500 mg total) by mouth 2 (two) times daily.  05/03/19   Coral Spikes, DO  omeprazole (PRILOSEC) 20 MG capsule Take 20 mg by mouth daily.  05/03/19  [provider]  pravastatin (PRAVACHOL) 10 MG tablet Take 10 mg by mouth daily.  05/03/19  [provider]  traZODone (DESYREL) 50 MG tablet Take 50 mg by mouth at bedtime.  05/03/19  [provider]    Family History Family History  Problem Relation Age of Onset  . Hyperlipidemia Mother   . Healthy Father        passed at age 23    Social History Social History   Tobacco Use  . Smoking status: Never Smoker  . Smokeless tobacco: Never Used  Substance Use Topics  . Alcohol use: Yes    Comment: social  . Drug use: No     Allergies   Patient has no known allergies.   Review of Systems Review of Systems  Constitutional: Negative for fever.  Gastrointestinal: Negative.   Genitourinary: Positive for dysuria, frequency and urgency.  Musculoskeletal: Negative for back pain.   Physical Exam Triage Vital Signs ED Triage Vitals  Enc Vitals Group     BP 05/03/19 1106 126/82     Pulse Rate 05/03/19 1106 86     Resp 05/03/19 1106 15     Temp 05/03/19 1106 98 F (36.7 C)     Temp Source 05/03/19 1106 Oral     SpO2 05/03/19 1106  99 %     Weight 05/03/19 1105 225 lb (102.1 kg)     Height 05/03/19 1105 6\' 1"  (1.854 m)     Head Circumference --      Peak Flow --      Pain Score 05/03/19 1105 0     Pain Loc --      Pain Edu? --      Excl. in GC? --    Updated Vital Signs BP 126/82 (BP Location: Right Arm)   Pulse 86   Temp 98 F (36.7 C) (Oral)   Resp 15   Ht 6\' 1"  (1.854 m)   Wt 102.1 kg   SpO2 99%   BMI 29.69 kg/m   Visual Acuity Right Eye Distance:   Left Eye Distance:   Bilateral Distance:    Right Eye Near:   Left Eye Near:    Bilateral Near:     Physical Exam Vitals and nursing note reviewed.  Constitutional:      General: She is not in acute distress.    Appearance: Normal appearance. She is not ill-appearing.  HENT:      Head: Normocephalic and atraumatic.  Eyes:     General:        Right eye: No discharge.        Left eye: No discharge.     Conjunctiva/sclera: Conjunctivae normal.  Cardiovascular:     Rate and Rhythm: Normal rate and regular rhythm.     Heart sounds: No murmur.  Pulmonary:     Effort: Pulmonary effort is normal.     Breath sounds: Normal breath sounds. No wheezing, rhonchi or rales.  Neurological:     Mental Status: She is alert.  Psychiatric:        Mood and Affect: Mood normal.        Behavior: Behavior normal.    UC Treatments / Results  Labs (all labs ordered are listed, but only abnormal results are displayed) Labs Reviewed  URINALYSIS, COMPLETE (UACMP) WITH MICROSCOPIC - Abnormal; Notable for the following components:      Result Value   APPearance CLOUDY (*)    Hgb urine dipstick LARGE (*)    Protein, ur 30 (*)    Leukocytes,Ua MODERATE (*)    Bacteria, UA MANY (*)    All other components within normal limits  URINE CULTURE    EKG   Radiology No results found.  Procedures Procedures (including critical care time)  Medications Ordered in UC Medications  cefTRIAXone (ROCEPHIN) injection 1 g (1 g Intramuscular Given 05/03/19 1143)    Initial Impression / Assessment and Plan / UC Course  I have reviewed the triage vital signs and the nursing notes.  Pertinent labs & imaging results that were available during my care of the patient were reviewed by me and considered in my medical decision making (see chart for details).    39 year old female presents with UTI. Patient requesting rocephin given severity of symptoms and hematuria. 1 g of Rocephin given today. Awaiting culture. Rx for keflex sent.  Final Clinical Impressions(s) / UC Diagnoses   Final diagnoses:  Acute cystitis with hematuria     Discharge Instructions     Medication as prescribed.  Take care  Dr. 05/05/19    ED Prescriptions    Medication Sig Dispense Auth. Provider   cephALEXin  (KEFLEX) 500 MG capsule Take 1 capsule (500 mg total) by mouth 2 (two) times daily. 14 capsule [place], [place], [place]  PDMP not reviewed this encounter.   Tommie Sams, [place] 05/03/19 1212

## 2019-05-05 ENCOUNTER — Telehealth: Payer: Self-pay | Admitting: Emergency Medicine

## 2019-05-05 ENCOUNTER — Telehealth: Payer: Self-pay | Admitting: *Deleted

## 2019-05-05 LAB — URINE CULTURE: Culture: >=100000 — AB

## 2019-05-05 MED ORDER — NITROFURANTOIN MONOHYD MACRO 100 MG PO CAPS: 100.0000 mg | ORAL_CAPSULE | Freq: Two times a day (BID) | ORAL | 0 refills | Status: AC

## 2019-05-05 NOTE — Telephone Encounter (Signed)
Patient was notified that her urine culture showed that it was resistant to her current antibiotic Keflex and was switched to Macrobid.  Patient notified that her prescription for Macrobid was sent to [facility].  Patient verbalized understanding.

## 2019-05-05 NOTE — Telephone Encounter (Signed)
We have received the results of your urine culture, ensuring we treated you with the correct antibiotic for the bacteria causing your UTI. The antibiotic prescribed will need to change to Macrobid 100mg  by mouth twice a day for 5 days. Please stop the Keflex and complete the course of the antibiotic and let know if you continue to have symptoms after the antibiotic is completed.

## 2019-05-05 NOTE — Telephone Encounter (Signed)
Left message for patient to call us back regarding her urine culture result.

## 2019-07-17 MED FILL — METOCLOPRAMIDE 10 MG TABLET: 10 | 8 days supply | Qty: 30 | Fill #0

## 2019-08-03 ENCOUNTER — Encounter: Payer: Self-pay | Admitting: Internal Medicine

## 2019-10-01 ENCOUNTER — Ambulatory Visit: Payer: 59 | Admitting: Internal Medicine

## 2019-12-03 IMAGING — CR DG CHEST 2V
2 series · 2 of 2 positions shown · non-contrast
Comparison: none

CLINICAL DATA: Hypoxia.

EXAM:
CHEST  2 VIEW

[chest pa]
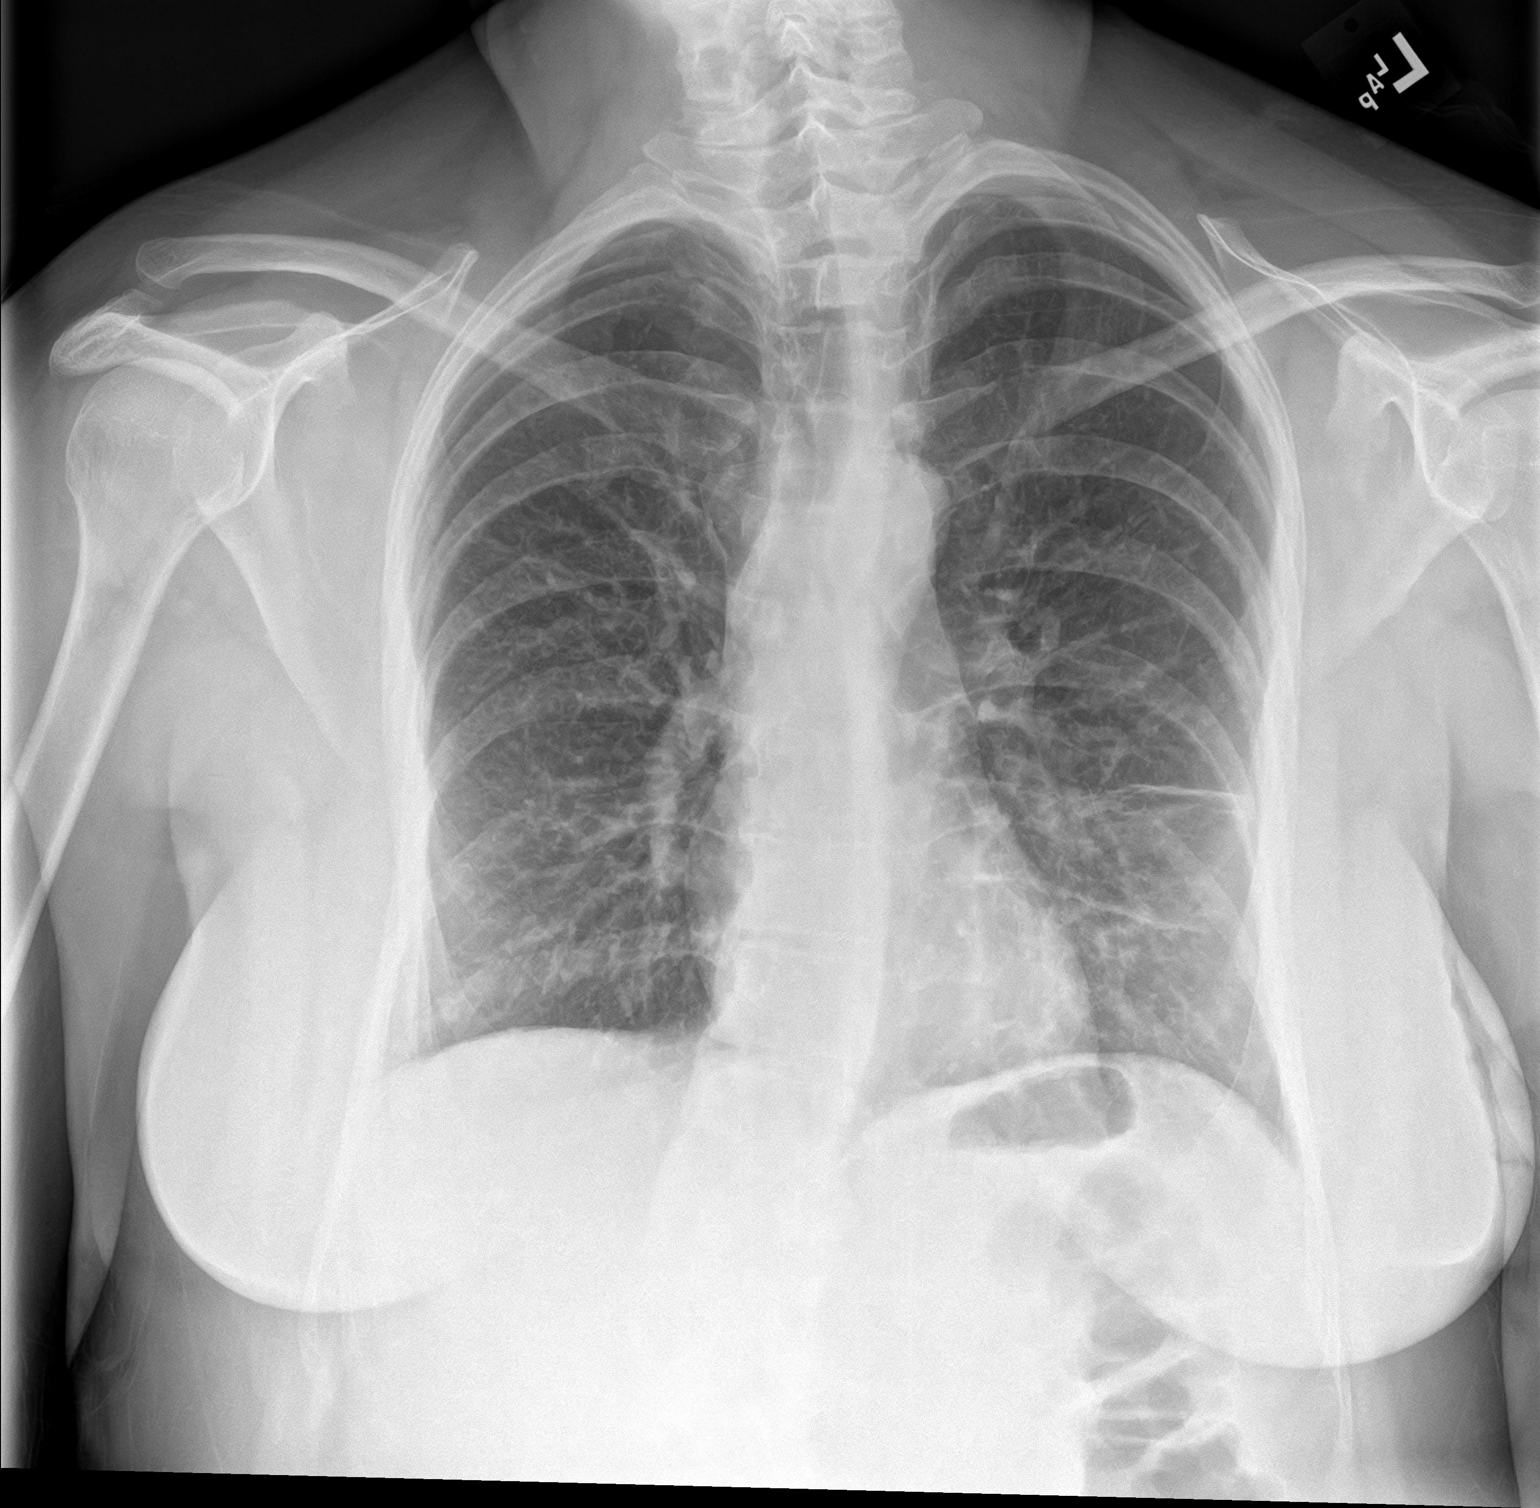

[chest lat]
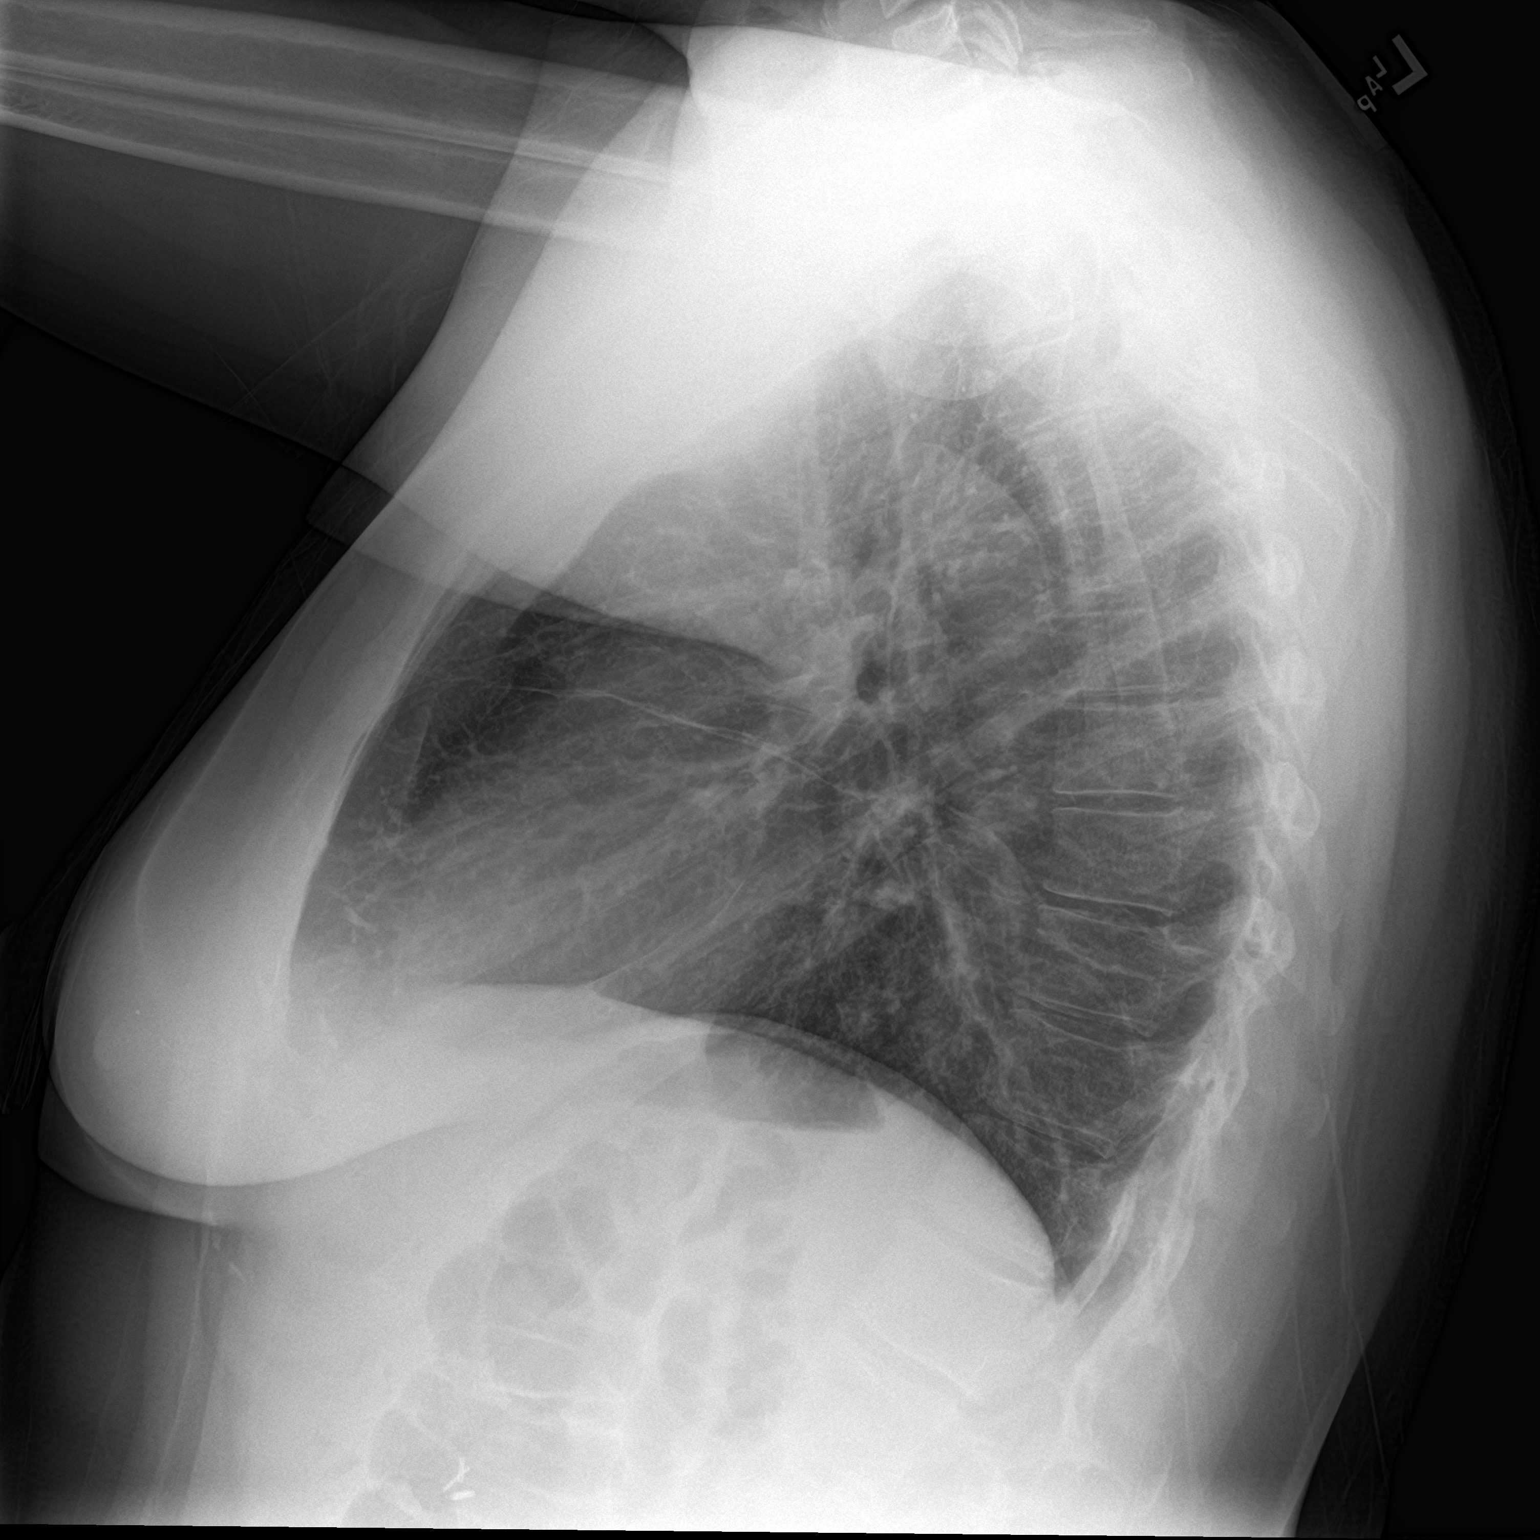

[2 of 2 positions shown; findings below may reference images not displayed]

FINDINGS: Normal heart size. There is no pleural effusion or edema. Lungs are
hyperinflated and there are coarsened interstitial markings noted
bilaterally. Scar versus platelike atelectasis is identified within
the left lower lobe.
IMPRESSION: 1. Left lower lobe scar versus platelike atelectasis.
2. Hyperinflation.

## 2019-12-07 ENCOUNTER — Other Ambulatory Visit: Payer: Self-pay

## 2019-12-07 ENCOUNTER — Other Ambulatory Visit
Admission: RE | Admit: 2019-12-07 | Discharge: 2019-12-07 | Disposition: A | Discharge status: Home / Self Care | Payer: 59 | Source: Ambulatory Visit | Attending: Gastroenterology | Admitting: Gastroenterology

## 2019-12-07 DIAGNOSIS — Z20822 Contact with and (suspected) exposure to covid-19: Secondary | ICD-10-CM | POA: Insufficient documentation

## 2019-12-07 DIAGNOSIS — Z01812 Encounter for preprocedural laboratory examination: Secondary | ICD-10-CM | POA: Insufficient documentation

## 2019-12-08 LAB — SARS CORONAVIRUS 2 (TAT 6-24 HRS): SARS Coronavirus 2: NEGATIVE

## 2019-12-10 ENCOUNTER — Ambulatory Visit (INDEPENDENT_AMBULATORY_CARE_PROVIDER_SITE_OTHER): Payer: 59 | Admitting: Psychology

## 2019-12-10 DIAGNOSIS — F4322 Adjustment disorder with anxiety: Secondary | ICD-10-CM | POA: Diagnosis not present

## 2019-12-10 DIAGNOSIS — F330 Major depressive disorder, recurrent, mild: Secondary | ICD-10-CM | POA: Diagnosis not present

## 2019-12-11 ENCOUNTER — Other Ambulatory Visit: Payer: Self-pay

## 2019-12-11 ENCOUNTER — Ambulatory Visit: Payer: 59 | Admitting: Anesthesiology

## 2019-12-11 ENCOUNTER — Encounter
Admission: RE | Disposition: A | Discharge status: Home / Self Care | Payer: Self-pay | Source: Ambulatory Visit | Attending: Gastroenterology

## 2019-12-11 ENCOUNTER — Ambulatory Visit
Admission: RE | Admit: 2019-12-11 | Discharge: 2019-12-11 | Disposition: A | Discharge status: Home / Self Care | Payer: 59 | Source: Ambulatory Visit | Attending: Gastroenterology | Admitting: Gastroenterology

## 2019-12-11 DIAGNOSIS — Z79899 Other long term (current) drug therapy: Secondary | ICD-10-CM | POA: Diagnosis not present

## 2019-12-11 DIAGNOSIS — R1031 Right lower quadrant pain: Secondary | ICD-10-CM | POA: Diagnosis not present

## 2019-12-11 DIAGNOSIS — R197 Diarrhea, unspecified: Secondary | ICD-10-CM | POA: Diagnosis not present

## 2019-12-11 HISTORY — PX: COLONOSCOPY WITH PROPOFOL: SHX5780

## 2019-12-11 SURGERY — COLONOSCOPY WITH PROPOFOL
Anesthesia: General

## 2019-12-11 MED ORDER — PROPOFOL 10 MG/ML IV BOLUS
INTRAVENOUS | Status: DC | PRN
  Administered 2019-12-11 (×2): 40 mg via INTRAVENOUS
  Administered 2019-12-11: 100 mg via INTRAVENOUS
  Administered 2019-12-11: 20 mg via INTRAVENOUS

## 2019-12-11 MED ORDER — PROPOFOL 500 MG/50ML IV EMUL
INTRAVENOUS | Status: DC | PRN
  Administered 2019-12-11: 133 ug/kg/min via INTRAVENOUS

## 2019-12-11 MED ORDER — LIDOCAINE HCL (PF) 2 % IJ SOLN
INTRAMUSCULAR | Status: AC
  Filled 2019-12-11: qty 5

## 2019-12-11 MED ORDER — LIDOCAINE HCL (CARDIAC) PF 100 MG/5ML IV SOSY
PREFILLED_SYRINGE | INTRAVENOUS | Status: DC | PRN
  Administered 2019-12-11: 100 mg via INTRAVENOUS

## 2019-12-11 MED ORDER — SODIUM CHLORIDE 0.9 % IV SOLN: INTRAVENOUS | Status: DC

## 2019-12-11 MED ORDER — ONDANSETRON HCL 4 MG/2ML IJ SOLN
INTRAMUSCULAR | Status: DC | PRN
  Administered 2019-12-11: 4 mg via INTRAVENOUS

## 2019-12-11 NOTE — Interval H&P Note (Signed)
History and Physical Interval Note:  12/11/2019 10:48 AM  Jennifer Mcintyre  has presented today for surgery, with the diagnosis of bilateral lower abdominal pain; Diarrhea.  The various methods of treatment have been discussed with the patient and family. After consideration of risks, benefits and other options for treatment, the patient has consented to  Procedure(s): COLONOSCOPY WITH PROPOFOL (N/A) as a surgical intervention.  The patient's history has been reviewed, patient examined, no change in status, stable for surgery.  I have reviewed the patient's chart and labs.  Questions were answered to the patient's satisfaction.     Regis Bill  Ok to proceed with colonoscopy

## 2019-12-11 NOTE — Op Note (Signed)
[facility] Gastroenterology Patient Name: Jennifer Mcintyre Procedure Date: 12/11/2019 10:51 AM MRN: [id number] Account #: [id number] Date of Birth: 16-Dec-1980 Admit Type: Outpatient Age: 39 Room: [facility] ENDO ROOM 1 Gender: Female Note Status: Finalized Procedure:             Colonoscopy Indications:           Lower abdominal pain, Clinically significant diarrhea                         of unexplained origin Providers:             Andrey Farmer MD, MD Referring MD:          Nelwyn Salisbury, PA Medicines:             Monitored Anesthesia Care Complications:         No immediate complications. Estimated blood loss:                         Minimal. Procedure:             Pre-Anesthesia Assessment:                        - Prior to the procedure, a History and Physical was                         performed, and patient medications and allergies were                         reviewed. The patient is competent. The risks and                         benefits of the procedure and the sedation options and                         risks were discussed with the patient. All questions                         were answered and informed consent was obtained.                         Patient identification and proposed procedure were                         verified by the physician, the nurse, the anesthetist                         and the technician in the endoscopy suite. Mental                         Status Examination: alert and oriented. Airway                         Examination: normal oropharyngeal airway and neck                         mobility. Respiratory Examination: clear to                         auscultation. CV Examination:  normal. Prophylactic                         Antibiotics: The patient does not require prophylactic                         antibiotics. Prior Anticoagulants: The patient has                         taken no previous anticoagulant or  antiplatelet                         agents. ASA Grade Assessment: II - A patient with mild                         systemic disease. After reviewing the risks and                         benefits, the patient was deemed in satisfactory                         condition to undergo the procedure. The anesthesia                         plan was to use monitored anesthesia care (MAC).                         Immediately prior to administration of medications,                         the patient was re-assessed for adequacy to receive                         sedatives. The heart rate, respiratory rate, oxygen                         saturations, blood pressure, adequacy of pulmonary                         ventilation, and response to care were monitored                         throughout the procedure. The physical status of the                         patient was re-assessed after the procedure.                        After obtaining informed consent, the colonoscope was                         passed under direct vision. Throughout the procedure,                         the patient's blood pressure, pulse, and oxygen                         saturations were monitored continuously. The was  introduced through the anus and advanced to the the                         terminal ileum. The colonoscopy was performed without                         difficulty. The patient tolerated the procedure well.                         The quality of the bowel preparation was adequate to                         identify polyps. Findings:      The perianal and digital rectal examinations were normal.      The terminal ileum appeared normal.      The colon (entire examined portion) appeared normal. Biopsies for       histology were taken with a cold forceps from the entire colon for       evaluation of microscopic colitis. Estimated blood loss was minimal.      The exam was otherwise  without abnormality on direct and retroflexion       views. Impression:            - The examined portion of the ileum was normal.                        - The entire examined colon is normal. Biopsied.                        - The examination was otherwise normal on direct and                         retroflexion views. Recommendation:        - Discharge patient to home.                        - Resume previous diet.                        - Continue present medications.                        - Await pathology results.                        - Repeat colonoscopy at age 47 for screening purposes.                        - Return to referring physician as previously                         scheduled. Procedure Code(s):     --- Professional ---                        [phone], Colonoscopy, flexible; with biopsy, single or                         multiple Diagnosis Code(s):     --- Professional ---  R10.30, Lower abdominal pain, unspecified                        R19.7, Diarrhea, unspecified CPT copyright 2019 American Medical Association. All rights reserved. The codes documented in this report are preliminary and upon coder review may  be revised to meet current compliance requirements. Andrey Farmer, MD Andrey Farmer MD, MD 12/11/2019 11:32:13 AM Number of Addenda: 0 Note Initiated On: 12/11/2019 10:51 AM Scope Withdrawal Time: 0 hours 10 minutes 52 seconds  Total Procedure Duration: 0 hours 25 minutes 22 seconds  Estimated Blood Loss:  Estimated blood loss was minimal.      [facility]

## 2019-12-11 NOTE — Transfer of Care (Signed)
Immediate Anesthesia Transfer of Care Note  Patient: Jennifer Mcintyre  Procedure(s) Performed: COLONOSCOPY WITH PROPOFOL (N/A )  Patient Location: PACU and Endoscopy Unit  Anesthesia Type:General  Level of Consciousness: drowsy and patient cooperative  Airway & Oxygen Therapy: Patient Spontanous Breathing  Post-op Assessment: Report given to RN and Post -op Vital signs reviewed and stable  Post vital signs: Reviewed and stable  Last Vitals:  Vitals Value Taken Time  BP 91/61 12/11/19 1134  Temp 36.2 C 12/11/19 1134  Pulse 68 12/11/19 1135  Resp 5 12/11/19 1135  SpO2 100 % 12/11/19 1135  Vitals shown include unvalidated device data.  Last Pain:  Vitals:   12/11/19 1134  TempSrc: Temporal  PainSc: Asleep         Complications: No complications documented.

## 2019-12-11 NOTE — H&P (Signed)
Outpatient short stay form Pre-procedure 12/11/2019 10:46 AM Merlyn Lot MD, MPH  Primary Physician: [facility]  Reason for visit:  Diarrhea/Abdominal pain  History of present illness:   39 y/o lady with abdominal pain and diarrhea here for colonoscopy. Non-specific colitis on previous colonoscopy with resolution of symptoms at that time. No blood thinners. History of hysterectomy. No family history of GI malignancies.    Current Facility-Administered Medications:  .  0.9 %  sodium chloride infusion, , Intravenous, Continuous, Keimya Briddell, Rossie Muskrat, MD, Last Rate: 20 mL/hr at 12/11/19 1013, New Bag at 12/11/19 1013  Medications Prior to Admission  Medication Sig Dispense Refill Last Dose  . FLUoxetine (PROZAC) 20 MG capsule Take 20 mg by mouth daily.   12/10/2019 at Unknown time  . lamoTRIgine (LAMICTAL) 150 MG tablet Take 150 mg by mouth daily.     Marland Kitchen atorvastatin (LIPITOR) 10 MG tablet Take 1 tablet by mouth daily.     Marland Kitchen buPROPion (WELLBUTRIN XL) 150 MG 24 hr tablet Take 150 mg by mouth daily. (Patient not taking: Reported on 12/11/2019)   Not Taking at Unknown time  . cephALEXin (KEFLEX) 500 MG capsule Take 1 capsule (500 mg total) by mouth 2 (two) times daily. (Patient not taking: Reported on 12/11/2019) 14 capsule 0 Not Taking at Unknown time     No Known Allergies   Past Medical History:  Diagnosis Date  . Cesarean section wound complication   . Complication of anesthesia    laryngospasm and aspiration during EGD 02/16/2017  . Depression   . Eczema   . Elevated lipids   . GERD (gastroesophageal reflux disease)   . Hidradenitis   . Hyperlipemia   . Scoliosis   . Wears contact lenses     Review of systems:  Otherwise negative.    Physical Exam  Gen: Alert, oriented. Appears stated age.  HEENT:  PERRLA. Lungs: No respiratory distress CV: RRR Abd: soft, benign, no masses Ext: No edema    Planned procedures: Proceed with colonoscopy. The patient understands  the nature of the planned procedure, indications, risks, alternatives and potential complications including but not limited to bleeding, infection, perforation, damage to internal organs and possible oversedation/side effects from anesthesia. The patient agrees and gives consent to proceed.  Please refer to procedure notes for findings, recommendations and patient disposition/instructions.     Merlyn Lot MD, MPH Gastroenterology 12/11/2019  10:46 AM

## 2019-12-11 NOTE — Anesthesia Preprocedure Evaluation (Signed)
Anesthesia Evaluation  Patient identified by MRN, date of birth, ID band Patient awake    Reviewed: Allergy & Precautions, H&P , NPO status , Patient's Chart, lab work & pertinent test results  History of Anesthesia Complications (+) history of anesthetic complications  Airway Mallampati: III  TM Distance: >3 FB Neck ROM: full    Dental  (+) Chipped   Pulmonary neg pulmonary ROS, neg shortness of breath,    Pulmonary exam normal        Cardiovascular (-) Past MI negative cardio ROS Normal cardiovascular exam     Neuro/Psych PSYCHIATRIC DISORDERS negative neurological ROS     GI/Hepatic negative GI ROS, Neg liver ROS, GERD  Medicated and Controlled,  Endo/Other  negative endocrine ROS  Renal/GU negative Renal ROS  negative genitourinary   Musculoskeletal   Abdominal   Peds  Hematology negative hematology ROS (+)   Anesthesia Other Findings Past Medical History: No date: Cesarean section wound complication No date: Complication of anesthesia     Comment:  laryngospasm and aspiration during EGD 02/16/2017 No date: Depression No date: Eczema No date: Elevated lipids No date: GERD (gastroesophageal reflux disease) No date: Hidradenitis No date: Hyperlipemia No date: Scoliosis No date: Wears contact lenses  Past Surgical History: No date: ABDOMINAL HYSTERECTOMY No date: CHOLECYSTECTOMY No date: COLONOSCOPY 08/04/2017: SEPTOPLASTY; Bilateral     Comment:  Procedure: SEPTOPLASTY;  Surgeon: Vernie Murders, MD;                Location: [facility];  Service: ENT;                Laterality: Bilateral; 08/04/2017: TONSILLECTOMY AND ADENOIDECTOMY; Bilateral     Comment:  Procedure: TONSILLECTOMY;  Surgeon: Vernie Murders, MD;                Location: [facility];  Service: ENT;                Laterality: Bilateral; No date: UPPER GI ENDOSCOPY  BMI    Body Mass Index: 22.96 kg/m       Reproductive/Obstetrics negative OB ROS                             Anesthesia Physical Anesthesia Plan  ASA: II  Anesthesia Plan: General   Post-op Pain Management:    Induction: Intravenous  PONV Risk Score and Plan: Propofol infusion and TIVA  Airway Management Planned: Natural Airway and Nasal Cannula  Additional Equipment:   Intra-op Plan:   Post-operative Plan:   Informed Consent: I have reviewed the patients History and Physical, chart, labs and discussed the procedure including the risks, benefits and alternatives for the proposed anesthesia with the patient or authorized representative who has indicated his/her understanding and acceptance.     Dental Advisory Given  Plan Discussed with: Anesthesiologist, CRNA and Surgeon  Anesthesia Plan Comments: (Patient consented for risks of anesthesia including but not limited to:  - adverse reactions to medications - risk of airway placement if required - damage to eyes, teeth, lips or other oral mucosa - nerve damage due to positioning  - sore throat or hoarseness - Damage to heart, brain, nerves, lungs, other parts of body or loss of life  Patient voiced understanding.)        Anesthesia Quick Evaluation

## 2019-12-12 ENCOUNTER — Encounter: Payer: Self-pay | Admitting: Gastroenterology

## 2019-12-12 LAB — SURGICAL PATHOLOGY

## 2019-12-12 NOTE — Anesthesia Postprocedure Evaluation (Signed)
Anesthesia Post Note  Patient: Jennifer Mcintyre  Procedure(s) Performed: COLONOSCOPY WITH PROPOFOL (N/A )  Patient location during evaluation: Endoscopy Anesthesia Type: General Level of consciousness: awake and alert Pain management: pain level controlled Vital Signs Assessment: post-procedure vital signs reviewed and stable Respiratory status: spontaneous breathing, nonlabored ventilation, respiratory function stable and patient connected to nasal cannula oxygen Cardiovascular status: blood pressure returned to baseline and stable Postop Assessment: no apparent nausea or vomiting Anesthetic complications: no   No complications documented.   Last Vitals:  Vitals:   12/11/19 1154 12/11/19 1204  BP: 106/69 110/64  Pulse: 61 (!) 56  Resp: 13 11  Temp:    SpO2: 99% 100%    Last Pain:  Vitals:   12/12/19 0733  TempSrc:   PainSc: 0-No pain                 [name]

## 2019-12-18 ENCOUNTER — Ambulatory Visit (INDEPENDENT_AMBULATORY_CARE_PROVIDER_SITE_OTHER): Payer: 59 | Admitting: Psychology

## 2019-12-18 DIAGNOSIS — F4322 Adjustment disorder with anxiety: Secondary | ICD-10-CM | POA: Diagnosis not present

## 2019-12-18 DIAGNOSIS — F330 Major depressive disorder, recurrent, mild: Secondary | ICD-10-CM | POA: Diagnosis not present

## 2020-01-03 ENCOUNTER — Ambulatory Visit: Payer: 59 | Admitting: Psychology

## 2020-01-15 ENCOUNTER — Ambulatory Visit (INDEPENDENT_AMBULATORY_CARE_PROVIDER_SITE_OTHER): Payer: 59 | Admitting: Psychology

## 2020-01-15 DIAGNOSIS — F331 Major depressive disorder, recurrent, moderate: Secondary | ICD-10-CM

## 2020-01-15 DIAGNOSIS — F4322 Adjustment disorder with anxiety: Secondary | ICD-10-CM

## 2020-02-19 ENCOUNTER — Ambulatory Visit: Payer: 59 | Admitting: Psychology

## 2020-03-05 ENCOUNTER — Ambulatory Visit (INDEPENDENT_AMBULATORY_CARE_PROVIDER_SITE_OTHER): Payer: 59 | Admitting: Psychology

## 2020-03-05 DIAGNOSIS — F4322 Adjustment disorder with anxiety: Secondary | ICD-10-CM | POA: Diagnosis not present

## 2020-03-05 DIAGNOSIS — F331 Major depressive disorder, recurrent, moderate: Secondary | ICD-10-CM | POA: Diagnosis not present

## 2020-03-26 ENCOUNTER — Ambulatory Visit (INDEPENDENT_AMBULATORY_CARE_PROVIDER_SITE_OTHER): Payer: 59 | Admitting: Psychology

## 2020-03-26 DIAGNOSIS — F4322 Adjustment disorder with anxiety: Secondary | ICD-10-CM | POA: Diagnosis not present

## 2020-03-26 DIAGNOSIS — F331 Major depressive disorder, recurrent, moderate: Secondary | ICD-10-CM | POA: Diagnosis not present

## 2020-04-16 ENCOUNTER — Ambulatory Visit: Payer: 59 | Admitting: Psychology

## 2020-07-16 ENCOUNTER — Ambulatory Visit: Payer: 59 | Admitting: Psychology

## 2020-07-26 ENCOUNTER — Ambulatory Visit: Payer: Self-pay

## 2020-07-30 ENCOUNTER — Ambulatory Visit: Payer: Self-pay | Admitting: Psychology

## 2022-06-25 ENCOUNTER — Ambulatory Visit: Payer: 59 | Admitting: Podiatry

## 2022-08-09 ENCOUNTER — Ambulatory Visit (INDEPENDENT_AMBULATORY_CARE_PROVIDER_SITE_OTHER): Payer: 59 | Admitting: Family Medicine

## 2022-08-09 ENCOUNTER — Encounter: Payer: Self-pay | Admitting: Family Medicine

## 2022-08-09 VITALS — BP 128/76 | HR 70 | Ht 73.0 in | Wt 177.0 lb

## 2022-08-09 DIAGNOSIS — M67951 Unspecified disorder of synovium and tendon, right thigh: Secondary | ICD-10-CM | POA: Insufficient documentation

## 2022-08-09 MED ORDER — DICLOFENAC SODIUM 50 MG PO TBEC: 50.0000 mg | DELAYED_RELEASE_TABLET | Freq: Two times a day (BID) | ORAL | 0 refills | Status: DC | PRN

## 2022-08-10 NOTE — Progress Notes (Signed)
     Primary Care / Sports Medicine Office Visit  Patient Information:  Patient ID: Jennifer Mcintyre, female DOB: Jun 01, 1980 Age: 42 y.o. MRN: [id number]   Jennifer Mcintyre is a pleasant 42 y.o. female presenting with the following:  Chief Complaint  Patient presents with   Hip Pain    Right 6 weeks, no injury no images     Vitals:   08/09/22 1430  BP: 128/76  Pulse: 70  SpO2: 97%   Vitals:   08/09/22 1430  Weight: 177 lb (80.3 kg)  Height: 6\' 1"  (1.854 m)   Body mass index is 23.35 kg/m.  No results found.   Independent interpretation of notes and tests performed by another provider:   None  Procedures performed:   None  Pertinent History, Exam, Impression, and Recommendations:   Jennifer Mcintyre was seen today for hip pain.  Tendinopathy of right gluteus medius Assessment & Plan: Atraumatic right posterolateral hip pain without radiation, ongoing x roughly 6 weeks. Denies any previous episodes, no paresthesias, no groin involvement. 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 Examination consistent with focal tenderness at the gluteus medius mid-distal to insertion, otherwise -SLR, equivocal FABER, -FADIR, exam otherwise benign.  We reviewed management options and plan as follows:  - Dose scheduled diclofenac twice daily with food - Close follow-up with plan for ultrasound-guided corticosteroid injection - Once symptoms allow, will benefit from home-based rehab   Orders: -     Diclofenac Sodium; Take 1 tablet (50 mg total) by mouth 2 (two) times daily as needed.  Dispense: 30 tablet; Refill: 0     Orders & Medications Meds ordered this encounter  Medications   diclofenac (VOLTAREN) 50 MG EC tablet    Sig: Take 1 tablet (50 mg total) by mouth 2 (two) times daily as needed.    Dispense:  30 tablet    Refill:  0   No orders of the defined types were placed in this encounter.    No follow-ups on file.     Jerrol Banana, MD, [facility]   Primary Care  Sports Medicine Primary Care and Sports Medicine at [facility]

## 2022-08-10 NOTE — Patient Instructions (Signed)
-   Dose scheduled diclofenac twice daily with food - Close follow-up with plan for ultrasound-guided corticosteroid injection

## 2022-08-10 NOTE — Assessment & Plan Note (Signed)
Atraumatic right posterolateral hip pain without radiation, ongoing x roughly 6 weeks. Denies any previous episodes, no paresthesias, no groin involvement. 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 Examination consistent with focal tenderness at the gluteus medius mid-distal to insertion, otherwise -SLR, equivocal FABER, -FADIR, exam otherwise benign.  We reviewed management options and plan as follows:  - Dose scheduled diclofenac twice daily with food - Close follow-up with plan for ultrasound-guided corticosteroid injection - Once symptoms allow, will benefit from home-based rehab

## 2022-08-11 ENCOUNTER — Encounter: Payer: Self-pay | Admitting: Family Medicine

## 2022-08-11 ENCOUNTER — Other Ambulatory Visit (INDEPENDENT_AMBULATORY_CARE_PROVIDER_SITE_OTHER): Payer: 59 | Admitting: Radiology

## 2022-08-11 ENCOUNTER — Ambulatory Visit: Payer: 59 | Admitting: Family Medicine

## 2022-08-11 VITALS — BP 112/70 | HR 84 | Ht 73.0 in | Wt 169.0 lb

## 2022-08-11 DIAGNOSIS — M67951 Unspecified disorder of synovium and tendon, right thigh: Secondary | ICD-10-CM

## 2022-08-11 MED ORDER — TRIAMCINOLONE ACETONIDE 40 MG/ML IJ SUSP
40.0000 mg | Freq: Once | INTRAMUSCULAR | Status: AC
  Administered 2022-08-11: 40 mg via INTRAMUSCULAR

## 2022-08-11 NOTE — Patient Instructions (Addendum)
You have just been given a cortisone injection to reduce pain and inflammation. After the injection you may notice immediate relief of pain as a result of the Lidocaine. It is important to rest the area of the injection for 24 to 48 hours after the injection. There is a possibility of some temporary increased discomfort and swelling for up to 72 hours until the cortisone begins to work. If you do have pain, simply rest the joint and use ice. If you can tolerate over the counter medications, you can try Tylenol for added relief per package instructions. - As above, relative rest x 2 days then gradually return to normal activity - Can use previously prescribed diclofenac twice daily as-needed (take with food) for additional pai control until symptoms response to cortisone - Start home exercises (see below) on a regular basis x 2-3 months then on a semi-regular "maintenance" basis - Contact us for any questions, symptom recurrence, and otherwise follow-up as-needed

## 2022-08-11 NOTE — Assessment & Plan Note (Signed)
Patient returns for corticosteroid injection to the gluteal tendon region of the right. She did get an opportunity to dose diclofenac with positive response.  - Tolerated procedure well, post-care reviewed - Start home rehab on a regular basis then modify to maintain symptom control - Can contact us for any symptom recurrence, pending clinical picture can determine next steps to include anything from repeat injection, advanced imaging, formal PT, medication management

## 2022-08-11 NOTE — Progress Notes (Signed)
     Primary Care / Sports Medicine Office Visit  Patient Information:  Patient ID: Jennifer Mcintyre, female DOB: 07-18-1980 Age: 42 y.o. MRN: [id number]   Jennifer Mcintyre is a pleasant 42 y.o. female presenting with the following:  Chief Complaint  Patient presents with   Hip Pain    Right hip, 2 on pain scale     Vitals:   08/11/22 0912  BP: 112/70  Pulse: 84  SpO2: 99%   Vitals:   08/11/22 0912  Weight: 169 lb (76.7 kg)  Height: 6\' 1"  (1.854 m)   Body mass index is 22.3 kg/m.  No results found.   Independent interpretation of notes and tests performed by another provider:   None  Procedures performed:   Procedure:  Injection of right mid-distal gluteal musculotendinous region under ultrasound guidance. Ultrasound guidance utilized for out of plane approach for peritendinous injection Samsung HS60 device utilized with permanent recording / reporting. Verbal informed consent obtained and verified. Skin prepped in a sterile fashion. Ethyl chloride for topical local analgesia.  Completed without difficulty and tolerated well. Medication: triamcinolone acetonide 40 mg/mL suspension for injection 1 mL total and 2 mL lidocaine 1% without epinephrine utilized for needle placement anesthetic Advised to contact for fevers/chills, erythema, induration, drainage, or persistent bleeding.   Pertinent History, Exam, Impression, and Recommendations:   [name] was seen today for hip pain.  Tendinopathy of right gluteus medius Assessment & Plan: Patient returns for corticosteroid injection to the gluteal tendon region of the right. She did get an opportunity to dose diclofenac with positive response.  - Tolerated procedure well, post-care reviewed - Start home rehab on a regular basis then modify to maintain symptom control - Can contact us for any symptom recurrence, pending clinical picture can determine next steps to include anything from repeat injection, advanced imaging, formal  PT, medication management  Orders: -     [place] LIMITED JOINT SPACE STRUCTURES LOW RIGHT; Future -     Triamcinolone Acetonide     Orders & Medications Meds ordered this encounter  Medications   triamcinolone acetonide (KENALOG-40) injection 40 mg   Orders Placed This Encounter  Procedures   [place] LIMITED JOINT SPACE STRUCTURES LOW RIGHT     No follow-ups on file.     Jerrol Banana, MD, [facility]   Primary Care Sports Medicine Primary Care and Sports Medicine at [facility]

## 2023-01-05 ENCOUNTER — Other Ambulatory Visit: Payer: Self-pay | Admitting: Medical Genetics

## 2023-01-26 ENCOUNTER — Other Ambulatory Visit (HOSPITAL_COMMUNITY)
Admission: RE | Admit: 2023-01-26 | Discharge: 2023-01-26 | Disposition: A | Discharge status: Home / Self Care | Payer: Self-pay | Source: Ambulatory Visit | Attending: Oncology | Admitting: Oncology

## 2023-02-17 LAB — GENECONNECT MOLECULAR SCREEN: Genetic Analysis Overall Interpretation: POSITIVE — AB

## 2023-02-18 ENCOUNTER — Encounter: Payer: Self-pay | Admitting: Medical Genetics

## 2023-02-19 ENCOUNTER — Telehealth: Payer: Self-pay | Admitting: Medical Genetics

## 2023-02-19 DIAGNOSIS — E7801 Familial hypercholesterolemia: Secondary | ICD-10-CM

## 2023-02-19 NOTE — Telephone Encounter (Signed)
[facility] Positive Result Note 02/19/2023 6:23 PM  FIRST ATTEMPT: Confirmed I was speaking with Jennifer Mcintyre [id number] by using name and DOB. Informed participant the reason for this call is to provide results for the above study. 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 [name] was not able to talk further and asked that I call her back tomorrow.   02/21/2023 9:33 AM  SECOND ATTEMPT: Confirmed I was speaking with Jennifer Mcintyre [id number] by using name and DOB. Genetic counseling was offered and participant requests a call back to schedule. All questions were answered, and participant was thanked for their time and support of the above study. Participant was encouraged to contact [facility] if they have any further questions or concerns.

## 2023-02-21 DIAGNOSIS — E7801 Familial hypercholesterolemia: Secondary | ICD-10-CM | POA: Insufficient documentation

## 2023-03-02 ENCOUNTER — Telehealth: Payer: Self-pay | Admitting: Medical Genetics

## 2023-03-03 NOTE — Telephone Encounter (Signed)
 [facility] 10/01/2023 9:36 AM  Genetic counseling was offered and participant has not responded after three contact attempts. Participant was encouraged to contact [facility] if they have any further questions or concerns or if they would like to schedule a Genetic Counseling appointment.   Newman Nip, BS [place]  [facility] Clinical Research Specialist II Direct Dial: [phone]  Fax: [phone]

## 2023-03-11 ENCOUNTER — Ambulatory Visit: Payer: 59 | Admitting: Student

## 2023-07-28 ENCOUNTER — Ambulatory Visit (INDEPENDENT_AMBULATORY_CARE_PROVIDER_SITE_OTHER): Admitting: Family Medicine

## 2023-07-28 ENCOUNTER — Other Ambulatory Visit (INDEPENDENT_AMBULATORY_CARE_PROVIDER_SITE_OTHER): Payer: Self-pay | Admitting: Radiology

## 2023-07-28 ENCOUNTER — Encounter: Payer: Self-pay | Admitting: Family Medicine

## 2023-07-28 ENCOUNTER — Ambulatory Visit: Payer: Self-pay

## 2023-07-28 VITALS — BP 122/80 | HR 90 | Ht 73.0 in

## 2023-07-28 DIAGNOSIS — M67951 Unspecified disorder of synovium and tendon, right thigh: Secondary | ICD-10-CM | POA: Diagnosis not present

## 2023-07-28 MED ORDER — TRIAMCINOLONE ACETONIDE 40 MG/ML IJ SUSP
40.0000 mg | Freq: Once | INTRAMUSCULAR | Status: AC
  Administered 2023-07-28: 40 mg via INTRAMUSCULAR

## 2023-07-28 MED ORDER — DICLOFENAC SODIUM 50 MG PO TBEC: 50.0000 mg | DELAYED_RELEASE_TABLET | Freq: Two times a day (BID) | ORAL | 0 refills | Status: DC | PRN

## 2023-07-28 MED ORDER — METHOCARBAMOL 500 MG PO TABS: 500.0000 mg | ORAL_TABLET | Freq: Three times a day (TID) | ORAL | 0 refills | Status: DC | PRN

## 2023-07-28 NOTE — Assessment & Plan Note (Signed)
 History of Present Illness Jennifer Mcintyre is a 43 year old female who presents with recurrent right lateral worsening hip pain during ADLs and general physical activity.  Lateral hip pain - Worsening pain during physical activity, particularly when standing and assisting in the operating room - Pain described as 'irritated again' - Pain localized to the distal one third of the gluteus medius distribution on the right  Occupational and psychosocial stressors - Works as a Immunologist with a demanding schedule, symptoms interfering  Physical Exam PALPATION: Non-tender at the right greater trochanter.  Maximal focal tenderness at the distal one third gluteus medius distribution.  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 Previous cortisone injection fully resolved issue. - Administer cortisone injection to affected area under ultrasound guidance. - Recommend hip exercises to strengthen gluteus medius.  See separate MyChart message. - Prescribe diclofenac  twice daily as needed for inflammation. - Prescribe Flexeril for muscle relaxation, cautioning about drowsiness. - Advise heat, Epsom salts, and topical Voltaren  gel as first-line treatments. - Instruct to use ice post-injection for pain management. - Advise to contact if pain persists or worsens. - Consider MRI if pain remains unresponsive.

## 2023-07-28 NOTE — Telephone Encounter (Signed)
 FYI Only or Action Required?: Action required by provider: update on patient condition.  Patient was last seen in primary care on 07/28/2023 by Alvia Selinda PARAS, MD.  Called Nurse Triage reporting Medication Reaction.  Symptoms began today.  Interventions attempted: Nothing.  Symptoms are: stable. Patient still able to walk, but has to think about her steps.   Triage Disposition: Call PCP Within 24 Hours  Patient/caregiver understands and will follow disposition?: Yes Copied from CRM #[id number]. Topic: Clinical - Medical Advice >> Jul 28, 2023 12:31 PM Wess RAMAN wrote: Reason for CRM: Patient states she can't feel her right leg due to a steroid injection into the hip given by Dr. Alvia today. She would like to know if this is normal  Callback #: [phone] Reason for Disposition  [1] Follow-up call from patient regarding patient's clinical status AND [2] information NON-URGENT  Answer Assessment - Initial Assessment Questions 1. REASON FOR CALL or QUESTION: What is your reason for calling today? or How can I best     Feels numb after steroid injection today by Dr .Alvia. Numb from lateral hip down to knee. Still able to walk just feels heavy  2. CALLER: Document the source of call. (e.g., laboratory staff, caregiver or patient).     Patient  Protocols used: PCP Call - No Triage-A-AH

## 2023-07-30 NOTE — Progress Notes (Signed)
 Primary Care / Sports Medicine Office Visit  Patient Information:  Patient ID: Jennifer Mcintyre, female DOB: [birth year] Age: 43 y.o. MRN: [id number]   Jennifer Mcintyre is a pleasant 43 y.o. female presenting with the following:  Chief Complaint  Patient presents with   Hip Pain    Patient presents today for a cortisone injection for her right hip. Right hip started to become painful a few weeks ago and has progressively got worse. She had a injection last year which has helped up until a few weeks ago.     Vitals:   07/28/23 0914  BP: 122/80  Pulse: 90  SpO2: 99%   Vitals:   07/28/23 0914  Height: 6' 1 (1.854 m)   Body mass index is 22.3 kg/m.  No results found.   Independent interpretation of notes and tests performed by another provider:   None  Procedures performed:   Procedure:  Injection of Right hip under ultrasound guidance. Ultrasound guidance utilized for out-of-plane approach to the distal third of the gluteus medius tendon region Samsung HS60 device utilized with permanent recording / reporting. Verbal informed consent obtained and verified. Skin prepped in a sterile fashion. Ethyl chloride for topical local analgesia.  Completed without difficulty and tolerated well. Medication: triamcinolone  acetonide 40 mg/mL suspension for injection 1 mL total and 2 mL lidocaine  1% without epinephrine  utilized for needle placement anesthetic Advised to contact for fevers/chills, erythema, induration, drainage, or persistent bleeding.   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  Lateral hip pain - Worsening pain during physical activity, particularly when standing and assisting in the operating  room - Pain described as 'irritated again' - Pain localized to the distal one third of the gluteus medius distribution on the right  Occupational and psychosocial stressors - Works as a Immunologist with a demanding schedule, symptoms interfering  Physical Exam PALPATION: Non-tender at the right greater trochanter.  Maximal focal tenderness at the distal one third gluteus medius distribution.  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 Previous cortisone injection fully resolved issue. - Administer cortisone injection to affected area under ultrasound guidance. - Recommend hip exercises to strengthen gluteus medius.  See separate MyChart message. - Prescribe diclofenac  twice daily as needed for inflammation. - Prescribe Flexeril for muscle relaxation, cautioning about drowsiness. - Advise heat, Epsom salts, and topical Voltaren  gel as first-line treatments. - Instruct to use ice post-injection for pain management. - Advise to contact if pain persists or worsens. - Consider MRI if pain remains unresponsive.      Relevant Medications   diclofenac  (VOLTAREN ) 50 MG EC tablet   methocarbamol  (ROBAXIN ) 500 MG tablet   Other Relevant Orders   US  LIMITED JOINT SPACE STRUCTURES LOW RIGHT     Orders & Medications Medications:  Meds ordered this encounter  Medications   diclofenac  (VOLTAREN ) 50 MG EC tablet    Sig: Take 1 tablet (50 mg total) by mouth 2 (two) times daily as needed.    Dispense:  60 tablet    Refill:  0   methocarbamol  (ROBAXIN ) 500 MG tablet    Sig: Take 1 tablet (  500 mg total) by mouth every 8 (eight) hours as needed for muscle spasms.    Dispense:  60 tablet    Refill:  0   triamcinolone  acetonide (KENALOG -40) injection 40 mg   Orders Placed This Encounter  Procedures   US  LIMITED JOINT SPACE STRUCTURES LOW RIGHT     No follow-ups on file.      Selinda JINNY Ku, MD, [facility]   Primary Care Sports Medicine Primary Care and Sports Medicine at [facility]

## 2023-07-30 NOTE — Patient Instructions (Addendum)
 Patient Plan for Post-Visit Guidance  1. Right Gluteus Medius Tendinopathy:    - Receive a cortisone injection in the affected area under ultrasound guidance.    - Perform hip exercises to strengthen the gluteus medius as detailed in the MyChart message.    - Take diclofenac  twice daily as needed for inflammation. Take with food.    - Use Flexeril for muscle relaxation, being cautious of drowsiness.    - Apply heat, Epsom salts, and topical Voltaren  gel as first-line treatments.    - Use ice post-injection for pain management.    - Contact the office if pain persists or worsens.    - Consider an MRI if pain remains unresponsive.  Red Flags: - If you experience any new or worsening symptoms, contact the office immediately.

## 2023-08-05 NOTE — Telephone Encounter (Signed)
 Please review. JM

## 2023-11-23 ENCOUNTER — Encounter: Payer: Self-pay | Admitting: Family Medicine

## 2023-11-23 NOTE — Telephone Encounter (Signed)
 Please review.  KP

## 2023-11-24 ENCOUNTER — Other Ambulatory Visit: Payer: Self-pay | Admitting: Family Medicine

## 2023-11-24 DIAGNOSIS — M67951 Unspecified disorder of synovium and tendon, right thigh: Secondary | ICD-10-CM

## 2023-11-25 ENCOUNTER — Ambulatory Visit: Admitting: Family Medicine

## 2023-11-25 ENCOUNTER — Other Ambulatory Visit (INDEPENDENT_AMBULATORY_CARE_PROVIDER_SITE_OTHER): Payer: Self-pay | Admitting: Radiology

## 2023-11-25 ENCOUNTER — Encounter: Payer: Self-pay | Admitting: Family Medicine

## 2023-11-25 VITALS — HR 60 | Temp 97.9°F | Ht 73.0 in

## 2023-11-25 DIAGNOSIS — M67951 Unspecified disorder of synovium and tendon, right thigh: Secondary | ICD-10-CM | POA: Diagnosis not present

## 2023-11-25 MED ORDER — METHYLPREDNISOLONE ACETATE 40 MG/ML IJ SUSP
40.0000 mg | Freq: Once | INTRAMUSCULAR | Status: AC
  Administered 2023-11-25: 40 mg via INTRAMUSCULAR

## 2023-11-25 MED ORDER — METHOCARBAMOL 500 MG PO TABS: 500.0000 mg | ORAL_TABLET | Freq: Three times a day (TID) | ORAL | 0 refills | Status: AC | PRN

## 2023-11-25 MED ORDER — DICLOFENAC SODIUM 50 MG PO TBEC: 50.0000 mg | DELAYED_RELEASE_TABLET | Freq: Two times a day (BID) | ORAL | 0 refills | Status: AC | PRN

## 2023-11-25 NOTE — Assessment & Plan Note (Signed)
 History of Present Illness Jennifer Mcintyre is a 43 year old female who presents with worsening right chronic hip pain despite previous cortisone injection.  Right hip pain - Chronic right hip pain localized to the distal third of the gluteus medius distribution - Initial relief following cortisone injection, with pain subsiding for approximately 10-11 months - Subsequent injection 07/28/2023 with roughly 4 months of response - Recent recurrence and worsening of pain over the past week - Pain intensity significantly disrupts sleep - Prescribed medications taken for the past three days have provided partial relief, improving sleep quality  Functional impact and contributing factors - Pain has significantly affected sleep quality - Demanding work schedule previously limited ability to participate in physical therapy - Plans to engage in physical therapy to address hip pain  Physical Exam PALPATION: Non-tender at the right greater trochanter, minimal tenderness at the right sacroiliac joint, and maximal tenderness at the distal one third of the right gluteus medius distribution. SPECIAL TESTS: Negative FADIR and iliopsoas tests on the right.  Assessment and Plan Right gluteus medius tendinopathy Chronic tendinopathy with recent exacerbation. Pain affects sleep and daily activities. MRI considered if PT and injections fail. Discussed tendinosis and flare-ups. Emphasized PT for management and potential PRP injections if tendinosis confirmed. - Have ordered MRI of right hip. - Refer to physical therapy for rehab program. - Administer cortisone injection to gluteus medius under ultrasound guidance. - Prescribe oral anti-inflammatories as needed, especially post-PT. - Advise icing area for 20 minutes at bedtime. - Send refills for anti-inflammatories. - Coordinate with PT for progress tracking.

## 2023-12-03 NOTE — Patient Instructions (Signed)
 VISIT SUMMARY:  Today, we discussed your chronic right hip pain. We reviewed your symptoms, their impact on your daily life, and developed a comprehensive plan to manage your pain and improve your function.  YOUR PLAN:  RIGHT GLUTEUS MEDIUS TENDINOPATHY: You have chronic tendinopathy in your right hip, which has recently worsened and is affecting your sleep and daily activities. -We will start with a customized physical therapy program to help manage your pain and improve function. -We ordered an MRI of your right hip. -You will receive a superficial cortisone injection today to help reduce inflammation and pain. -Take oral anti-inflammatory medications as needed, especially after physical therapy sessions. -Apply ice to the affected area for 20 minutes before bedtime to help reduce pain and inflammation. -I have sent refills for your anti-inflammatory medications. -We will coordinate with your physical therapist to track your progress and adjust your treatment plan as needed.

## 2023-12-03 NOTE — Progress Notes (Signed)
 Primary Care / Sports Medicine Office Visit  Patient Information:  Patient ID: Jennifer Mcintyre, female DOB: 07-23-1980 Age: 43 y.o. MRN: [id number]   Jennifer Mcintyre is a pleasant 43 y.o. female presenting with the following:  Chief Complaint  Patient presents with   Injections    Patient presents today for her right hip follow-up    Vitals:   11/25/23 0814  Pulse: 60  Temp: 97.9 F (36.6 C)  SpO2: 100%   Vitals:   11/25/23 0814  Height: 6' 1 (1.854 m)   Body mass index is 22.3 kg/m.  No results found.   Discussed the use of AI scribe software for clinical note transcription with the patient, who gave verbal consent to proceed.   Independent interpretation of notes and tests performed by another provider:   None  Procedures performed:   Procedure:  Injection of right gluteus medius musculotendinous junction under ultrasound guidance. Ultrasound guidance utilized for out-of-plane approach to visualize distal third of complex Samsung HS60 device utilized with permanent recording / reporting. Verbal informed consent obtained and verified. Skin prepped in a sterile fashion. Ethyl chloride for topical local analgesia.  Completed without difficulty and tolerated well. Medication: triamcinolone  acetonide 40 mg/mL suspension for injection 1 mL total and 2 mL lidocaine  1% without epinephrine  utilized for needle placement anesthetic Advised to contact for fevers/chills, erythema, induration, drainage, or persistent bleeding.   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  Right hip pain - Chronic right hip pain localized to the distal third of the gluteus medius distribution - Initial relief following cortisone  injection, with pain subsiding for approximately 10-11 months - Subsequent injection 07/28/2023 with roughly 4 months of response - Recent recurrence and worsening of pain over the past week - Pain intensity significantly disrupts sleep - Prescribed medications taken for the past three days have provided partial relief, improving sleep quality  Functional impact and contributing factors - Pain has significantly affected sleep quality - Demanding work schedule previously limited ability to participate in physical therapy - Plans to engage in physical therapy to address hip pain  Physical Exam PALPATION: Non-tender at the right greater trochanter, minimal tenderness at the right sacroiliac joint, and maximal tenderness at the distal one third of the right gluteus medius distribution. SPECIAL TESTS: Negative FADIR and iliopsoas tests on the right.  Assessment and Plan Right gluteus medius tendinopathy Chronic tendinopathy with recent exacerbation. Pain affects sleep and daily activities. MRI considered if PT and injections fail. Discussed tendinosis and flare-ups. Emphasized PT for management and potential PRP injections if tendinosis confirmed. - Have ordered MRI of right hip. - Refer to physical therapy for rehab program. - Administer cortisone injection to gluteus medius under ultrasound guidance. - Prescribe oral anti-inflammatories as needed, especially post-PT. - Advise icing area for 20 minutes at bedtime. - Send refills for anti-inflammatories. - Coordinate with PT for progress tracking.      Relevant Medications   diclofenac  (VOLTAREN ) 50 MG EC tablet   methocarbamol  (ROBAXIN ) 500 MG tablet   Other Relevant Orders   US  LIMITED JOINT SPACE STRUCTURES LOW RIGHT   Ambulatory referral to Physical Therapy     Orders & Medications Medications:  Meds ordered this encounter  Medications  methylPREDNISolone  acetate (DEPO-MEDROL ) injection 40 mg   diclofenac  (VOLTAREN ) 50 MG EC  tablet    Sig: Take 1 tablet (50 mg total) by mouth 2 (two) times daily as needed.    Dispense:  60 tablet    Refill:  0   methocarbamol  (ROBAXIN ) 500 MG tablet    Sig: Take 1 tablet (500 mg total) by mouth every 8 (eight) hours as needed for muscle spasms.    Dispense:  60 tablet    Refill:  0   Orders Placed This Encounter  Procedures   US  LIMITED JOINT SPACE STRUCTURES LOW RIGHT   Ambulatory referral to Physical Therapy     No follow-ups on file.     Selinda JINNY Ku, MD, [facility]   Primary Care Sports Medicine Primary Care and Sports Medicine at [facility]

## 2023-12-07 ENCOUNTER — Ambulatory Visit
